# Patient Record
Sex: Female | Born: 1990 | Race: White | Hispanic: Yes | Marital: Single | Smoking: Never smoker
Health system: Southern US, Community
[De-identification: ages and names within clinical notes are randomized; demographics above are authoritative.]

## PROBLEM LIST (undated history)

## (undated) DIAGNOSIS — O139 Gestational [pregnancy-induced] hypertension without significant proteinuria, unspecified trimester: Secondary | ICD-10-CM

## (undated) DIAGNOSIS — Z789 Other specified health status: Secondary | ICD-10-CM

## (undated) HISTORY — DX: Gestational (pregnancy-induced) hypertension without significant proteinuria, unspecified trimester: O13.9

## (undated) HISTORY — PX: BREAST SURGERY: SHX581

## (undated) NOTE — *Deleted (*Deleted)
History     CSN: [id number]  Arrival date and time: 12/01/19 4098   First Provider Initiated Contact with Patient 12/01/19 2111      Chief Complaint  Patient presents with  . Vaginal Bleeding   29 y.o. G1 @16 .1 wks presenting with VB. Bleeding started 5 days ago. She is using multiple panty liners every day. Bleeding is red. Reports passing a large clot into the toilet tonight. Denies abdominal pain or cramping. She was seen 2 days ago in the office and told everything was ok.   OB History    Gravida  1   Para  0   Term  0   Preterm  0   AB  0   Living        SAB  0   TAB  0   Ectopic  0   Multiple      Live Births              Past Medical History:  Diagnosis Date  . Medical history non-contributory     Past Surgical History:  Procedure Laterality Date  . BREAST SURGERY     breast bx left breast    Family History  Problem Relation Age of Onset  . Cancer Mother        pancreatic  . Diabetes Mother   . Diabetes Father   . Diabetes Maternal Grandmother   . Hyperlipidemia Maternal Grandmother   . Diabetes Maternal Grandfather   . Hyperlipidemia Maternal Grandfather   . Hyperlipidemia Paternal Grandmother   . Hyperlipidemia Paternal Grandfather     Social History   Tobacco Use  . Smoking status: Never Smoker  . Smokeless tobacco: Never Used  Vaping Use  . Vaping Use: Never used  Substance Use Topics  . Alcohol use: Never  . Drug use: Never    Allergies: No Known Allergies  Medications Prior to Admission  Medication Sig Dispense Refill Last Dose  . folic acid (FOLVITE) 1 MG tablet Take 1 mg by mouth daily.   12/01/2019 at Unknown time  . Prenatal Vit-Fe Fumarate-FA (PRENATAL MULTIVITAMIN) TABS tablet Take 1 tablet by mouth daily at 12 noon.   12/01/2019 at Unknown time  . Prenatal Vit-Fe Fumarate-FA (PRENATAL MULTIVITAMIN) TABS tablet Take 1 tablet by mouth daily at 12 noon.       Review of Systems  Gastrointestinal: Negative for  abdominal pain.  Genitourinary: Positive for vaginal bleeding.   Physical Exam   Blood pressure 132/73, pulse 89, temperature 99.2 F (37.3 C), temperature source Oral, resp. rate 20, height 5\' 2"  (1.575 m), weight 75.3 kg, last menstrual period 07/27/2019.  Physical Exam Vitals and nursing note reviewed. Exam conducted with a chaperone present.  Constitutional:      General: She is not in acute distress.    Appearance: Normal appearance.  HENT:     Head: Normocephalic and atraumatic.  Cardiovascular:     Rate and Rhythm: Normal rate.  Pulmonary:     Effort: Pulmonary effort is normal. No respiratory distress.  Abdominal:     General: There is no distension.     Palpations: Abdomen is soft.     Tenderness: There is no abdominal tenderness.  Genitourinary:    Comments: External: no lesions or erythema Vagina: rugated, pink, moist, scant pink bloody discharge, cleared with 1 fox swab Cervix closed, thick, soft  Musculoskeletal:        General: Normal range of motion.     Cervical back: Normal  range of motion.  Skin:    General: Skin is warm and dry.  Neurological:     General: No focal deficit present.     Mental Status: She is alert and oriented to person, place, and time.  Psychiatric:        Mood and Affect: Mood normal.        Behavior: Behavior normal.   FHT 168  Results for orders placed or performed during the hospital encounter of 12/01/19 (from the past 24 hour(s))  Urinalysis, Routine w reflex microscopic Urine, Clean Catch     Status: Abnormal   Collection Time: 12/01/19  8:12 PM  Result Value Ref Range   Color, Urine YELLOW YELLOW   APPearance CLEAR CLEAR   Specific Gravity, Urine 1.005 1.005 - 1.030   pH 7.0 5.0 - 8.0   Glucose, UA NEGATIVE NEGATIVE mg/dL   Hgb urine dipstick LARGE (A) NEGATIVE   Bilirubin Urine NEGATIVE NEGATIVE   Ketones, ur NEGATIVE NEGATIVE mg/dL   Protein, ur NEGATIVE NEGATIVE mg/dL   Nitrite NEGATIVE NEGATIVE   Leukocytes,Ua  MODERATE (A) NEGATIVE   RBC / HPF 21-50 0 - 5 RBC/hpf   WBC, UA 11-20 0 - 5 WBC/hpf   Bacteria, UA FEW (A) NONE SEEN   Squamous Epithelial / LPF 0-5 0 - 5   MAU Course  Procedures  MDM Labs and [place] ordered. Transfer of care given to Lorenda Peck, [place]  12/01/2019 9:47 PM  3.8  Assessment and Plan

---

## 2018-08-19 ENCOUNTER — Other Ambulatory Visit: Payer: Self-pay

## 2018-08-20 ENCOUNTER — Ambulatory Visit (INDEPENDENT_AMBULATORY_CARE_PROVIDER_SITE_OTHER): Payer: Managed Care, Other (non HMO) | Admitting: Obstetrics & Gynecology

## 2018-08-20 ENCOUNTER — Encounter: Payer: Self-pay | Admitting: Obstetrics & Gynecology

## 2018-08-20 VITALS — BP 120/78 | Ht 62.0 in | Wt 147.0 lb

## 2018-08-20 DIAGNOSIS — Z01419 Encounter for gynecological examination (general) (routine) without abnormal findings: Secondary | ICD-10-CM | POA: Diagnosis not present

## 2018-08-20 DIAGNOSIS — N970 Female infertility associated with anovulation: Secondary | ICD-10-CM

## 2018-08-20 NOTE — Progress Notes (Signed)
Laura [name] Jan 28, 1990 [id number]   History:    28 y.o.  G0 Married x 5 yrs  RP:  New patient presenting for annual gyn exam   HPI: Attempting conception x 2 yrs.  Menses every 1-2 months, normal flow.  No BTB.  No pelvic pain.  No pain with IC.  Having timed IC.  Breasts normal.  BMI 26.89.  No H/O STI.  Husband had a conception which resulted in TAb 6 yrs ago with previous partner.  Past medical history,surgical history, family history and social history were all reviewed and documented in the EPIC chart.  Gynecologic History Patient's last menstrual period was 08/12/2018. Contraception: none Last Pap: 06/2017. Results were: normal per patient.  1 abnormal pap 4 yrs ago, Colpo normal per patient. Last mammogram: Never Bone Density: Never Colonoscopy: Never  Obstetric History OB History  Gravida Para Term Preterm AB Living  0 0 0 0 0 0  SAB TAB Ectopic Multiple Live Births  0 0 0 0 0     ROS: A ROS was performed and pertinent positives and negatives are included in the history.  GENERAL: No fevers or chills. HEENT: No change in vision, no earache, sore throat or sinus congestion. NECK: No pain or stiffness. CARDIOVASCULAR: No chest pain or pressure. No palpitations. PULMONARY: No shortness of breath, cough or wheeze. GASTROINTESTINAL: No abdominal pain, nausea, vomiting or diarrhea, melena or bright red blood per rectum. GENITOURINARY: No urinary frequency, urgency, hesitancy or dysuria. MUSCULOSKELETAL: No joint or muscle pain, no back pain, no recent trauma. DERMATOLOGIC: No rash, no itching, no lesions. ENDOCRINE: No polyuria, polydipsia, no heat or cold intolerance. No recent change in weight. HEMATOLOGICAL: No anemia or easy bruising or bleeding. NEUROLOGIC: No headache, seizures, numbness, tingling or weakness. PSYCHIATRIC: No depression, no loss of interest in normal activity or change in sleep pattern.     Exam:   BP 120/78    Ht 5\' 2"  (1.575 m)    Wt 147 lb (66.7  kg)    LMP 08/12/2018    BMI 26.89 kg/m   Body mass index is 26.89 kg/m.  General appearance : Well developed well nourished female. No acute distress HEENT: Eyes: no retinal hemorrhage or exudates,  Neck supple, trachea midline, no carotid bruits, no thyroidmegaly Lungs: Clear to auscultation, no rhonchi or wheezes, or rib retractions  Heart: Regular rate and rhythm, no murmurs or gallops Breast:Examined in sitting and supine position were symmetrical in appearance, no palpable masses or tenderness,  no skin retraction, no nipple inversion, no nipple discharge, no skin discoloration, no axillary or supraclavicular lymphadenopathy Abdomen: no palpable masses or tenderness, no rebound or guarding Extremities: no edema or skin discoloration or tenderness  Pelvic: Vulva: Normal             Vagina: No gross lesions or discharge  Cervix: No gross lesions or discharge.  Pap reflex done.  Uterus  AV, normal size, shape and consistency, non-tender and mobile  Adnexa  Without masses or tenderness  Anus: Normal   Assessment/Plan:  28 y.o. female for annual exam   1. Encounter for routine gynecological examination with Papanicolaou smear of cervix Normal gynecologic exam.  Pap reflex done.  Breast exam normal.  Body mass index 26.89.  Continue with healthy nutrition and fitness.  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  Will investigate for PCOS.  Rule out tubal disease with a HSG under Doxy and rule out female factor with a  sperm Analysis.  Will probably start on clomiphene stimulation of ovulation per results. - TSH; Future - Prolactin; Future - Hemoglobin A1C; Future - Progesterone; Future  Other orders - famotidine (PEPCID) 20 MG tablet; Take 20 mg by mouth 2 (two) times daily.  Counseling on above issues and coordination of care more than 50% for 20 minutes.  Genia DelMarie-Lyne Woodrow Drab MD, 11:41 AM 08/20/2018

## 2018-08-23 ENCOUNTER — Encounter: Payer: Self-pay | Admitting: Obstetrics & Gynecology

## 2018-08-23 LAB — PAP IG W/ RFLX HPV ASCU

## 2018-08-23 NOTE — Patient Instructions (Signed)
1. Encounter for routine gynecological examination with Papanicolaou smear of cervix Normal gynecologic exam.  Pap reflex done.  Breast exam normal.  Body mass index 26.89.  Continue with healthy nutrition and fitness.  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  Will investigate for PCOS.  Rule out tubal disease with a HSG under Doxy and rule out female factor with a sperm Analysis.  Will probably start on clomiphene stimulation of ovulation per results. - TSH; Future - Prolactin; Future - Hemoglobin A1C; Future - Progesterone; Future  Other orders - famotidine (PEPCID) 20 MG tablet; Take 20 mg by mouth 2 (two) times daily.  Lamar Benes, fue un placer de encontrarle hoy!  Voy a informarle de sus [employer].

## 2018-08-24 ENCOUNTER — Telehealth: Payer: Self-pay | Admitting: *Deleted

## 2018-08-24 NOTE — Telephone Encounter (Signed)
-----   Message from Princess Bruins, MD sent at 08/20/2018 12:00 PM EDT ----- Schedule HSG under Doxy and Sperm analysis.

## 2018-08-24 NOTE — Telephone Encounter (Signed)
Referral faxed to [facility] they will call to schedule.  

## 2019-01-28 NOTE — L&D Delivery Note (Signed)
Patient was C/C and fetus delivered spontaneously intact.  NSVD  Female infant c/w about 18 weeks, Apgars 0,0, weight P.   The patient had no lacerations.  Placenta has not yet delivered at this time.   Laura [name]

## 2019-02-03 ENCOUNTER — Other Ambulatory Visit: Payer: Self-pay

## 2019-02-07 ENCOUNTER — Encounter: Payer: Self-pay | Admitting: Obstetrics & Gynecology

## 2019-02-07 ENCOUNTER — Ambulatory Visit (INDEPENDENT_AMBULATORY_CARE_PROVIDER_SITE_OTHER): Payer: Managed Care, Other (non HMO) | Admitting: Obstetrics & Gynecology

## 2019-02-07 ENCOUNTER — Other Ambulatory Visit: Payer: Self-pay

## 2019-02-07 VITALS — BP 106/70

## 2019-02-07 DIAGNOSIS — Z3200 Encounter for pregnancy test, result unknown: Secondary | ICD-10-CM | POA: Diagnosis not present

## 2019-02-07 LAB — PREGNANCY, URINE: Preg Test, Ur: NEGATIVE

## 2019-02-07 NOTE — Patient Instructions (Signed)
1. Possible pregnancy, not yet confirmed Attempting conception for a few years.  Documented ovulation in urine, but menstrual periods are every 1 to 2 months.  One home pregnancy test positive, but then negative.  Urine pregnancy test negative here today.  Will confirm with serum qualitative hCG.  If no menstrual period,  will come back to perform a pregnancy test again.  Continue on prenatal vitamins.  Will continue to attempt conception without fertility treatment. - Pregnancy, urine - hCG, serum, qualitative  Other orders - Prenatal Vit-Fe Fumarate-FA (PRENATAL MULTIVITAMIN) TABS tablet; Take 1 tablet by mouth daily at 12 noon.  Tenna Child, fue un placer verle hoy!  Voy a informarle de sus [employer].

## 2019-02-07 NOTE — Progress Notes (Signed)
    Tomorrow Laura [name] 12/19/1990 [id number]        29 y.o.  G0 Married x 5 1/2 yrs.  RP: Urin HPT positive x1  HPI: Attempting conception for a few years.  Menstrual periods every 1 to 2 months.  LMP normal 01/07/2019.  Ovulatory test positive 01/18/19 and sexually active at that time.  Had 1 HPT positive, but repeated it and it was negative.  No pelvic pain, mild premenstrual cramps.  Normal vaginal secretions.  Urine/BMs normal.   OB History  Gravida Para Term Preterm AB Living  0 0 0 0 0 0  SAB TAB Ectopic Multiple Live Births  0 0 0 0 0    Past medical history,surgical history, problem list, medications, allergies, family history and social history were all reviewed and documented in the EPIC chart.   Directed ROS with pertinent positives and negatives documented in the history of present illness/assessment and plan.  Exam:  Vitals:   02/07/19 0935  BP: 106/70   General appearance:  Normal  Abdomen: Normal  Gynecologic exam: Vulva normal. Uterus AV/normal volume, mobile, NT.  No adnexal mass, NT.  UPT Negative   Assessment/Plan:  30 y.o. G0P0000   1. Possible pregnancy, not yet confirmed Attempting conception for a few years.  Documented ovulation in urine, but menstrual periods are every 1 to 2 months.  One home pregnancy test positive, but then negative.  Urine pregnancy test negative here today.  Will confirm with serum qualitative hCG.  If no menstrual period,  will come back to perform a pregnancy test again.  Continue on prenatal vitamins.  Will continue to attempt conception without fertility treatment. - Pregnancy, urine - hCG, serum, qualitative  Other orders - Prenatal Vit-Fe Fumarate-FA (PRENATAL MULTIVITAMIN) TABS tablet; Take 1 tablet by mouth daily at 12 noon.  Counseling on above issues and coordination of care more than 50% for 25 minutes.  [name] MD, 10:06 AM 02/07/2019

## 2019-02-08 LAB — HCG, SERUM, QUALITATIVE: Preg, Serum: NEGATIVE

## 2019-10-26 ENCOUNTER — Encounter (HOSPITAL_COMMUNITY): Payer: Self-pay | Admitting: Obstetrics and Gynecology

## 2019-10-26 ENCOUNTER — Other Ambulatory Visit: Payer: Self-pay

## 2019-10-26 ENCOUNTER — Inpatient Hospital Stay (HOSPITAL_COMMUNITY)
Admission: AD | Admit: 2019-10-26 | Discharge: 2019-10-26 | Disposition: A | Discharge status: Home / Self Care | Payer: Self-pay | Attending: Obstetrics and Gynecology | Admitting: Obstetrics and Gynecology

## 2019-10-26 DIAGNOSIS — Z3A13 13 weeks gestation of pregnancy: Secondary | ICD-10-CM | POA: Insufficient documentation

## 2019-10-26 DIAGNOSIS — R109 Unspecified abdominal pain: Secondary | ICD-10-CM | POA: Insufficient documentation

## 2019-10-26 DIAGNOSIS — O209 Hemorrhage in early pregnancy, unspecified: Secondary | ICD-10-CM | POA: Insufficient documentation

## 2019-10-26 DIAGNOSIS — N939 Abnormal uterine and vaginal bleeding, unspecified: Secondary | ICD-10-CM

## 2019-10-26 DIAGNOSIS — O26891 Other specified pregnancy related conditions, first trimester: Secondary | ICD-10-CM | POA: Insufficient documentation

## 2019-10-26 DIAGNOSIS — R110 Nausea: Secondary | ICD-10-CM | POA: Insufficient documentation

## 2019-10-26 DIAGNOSIS — R102 Pelvic and perineal pain: Secondary | ICD-10-CM | POA: Insufficient documentation

## 2019-10-26 DIAGNOSIS — O4691 Antepartum hemorrhage, unspecified, first trimester: Secondary | ICD-10-CM

## 2019-10-26 LAB — WET PREP, GENITAL
Clue Cells Wet Prep HPF POC: NONE SEEN
Sperm: NONE SEEN
Trich, Wet Prep: NONE SEEN
Yeast Wet Prep HPF POC: NONE SEEN

## 2019-10-26 LAB — URINALYSIS, ROUTINE W REFLEX MICROSCOPIC
Bilirubin Urine: NEGATIVE
Glucose, UA: NEGATIVE mg/dL
Ketones, ur: NEGATIVE mg/dL
Leukocytes,Ua: NEGATIVE
Nitrite: NEGATIVE
Protein, ur: NEGATIVE mg/dL
Specific Gravity, Urine: 1.019 (ref 1.005–1.030)
pH: 6 (ref 5.0–8.0)

## 2019-10-26 LAB — RH IG WORKUP (INCLUDES ABO/RH)
ABO/RH(D): A POS
Gestational Age(Wks): 13

## 2019-10-26 NOTE — Discharge Instructions (Signed)
First Trimester of Pregnancy  The first trimester of pregnancy is from week 1 until the end of week 13 (months 1 through 3). During this time, your baby will begin to develop inside you. At 6-8 weeks, the eyes and face are formed, and the heartbeat can be seen on ultrasound. At the end of 12 weeks, all the baby's organs are formed. Prenatal care is all the medical care you receive before the birth of your baby. Make sure you get good prenatal care and follow all of your doctor's instructions. Follow these instructions at home: Medicines  Take over-the-counter and prescription medicines only as told by your doctor. Some medicines are safe and some medicines are not safe during pregnancy.  Take a prenatal vitamin that contains at least 600 micrograms (mcg) of folic acid.  If you have trouble pooping (constipation), take medicine that will make your stool soft (stool softener) if your doctor approves. Eating and drinking   Eat regular, healthy meals.  Your doctor will tell you the amount of weight gain that is right for you.  Avoid raw meat and uncooked cheese.  If you feel sick to your stomach (nauseous) or throw up (vomit): ? Eat 4 or 5 small meals a day instead of 3 large meals. ? Try eating a few soda crackers. ? Drink liquids between meals instead of during meals.  To prevent constipation: ? Eat foods that are high in fiber, like fresh fruits and vegetables, whole grains, and beans. ? Drink enough fluids to keep your pee (urine) clear or pale yellow. Activity  Exercise only as told by your doctor. Stop exercising if you have cramps or pain in your lower belly (abdomen) or low back.  Do not exercise if it is too hot, too humid, or if you are in a place of great height (high altitude).  Try to avoid standing for long periods of time. Move your legs often if you must stand in one place for a long time.  Avoid heavy lifting.  Wear low-heeled shoes. Sit and stand up  straight.  You can have sex unless your doctor tells you not to. Relieving pain and discomfort  Wear a good support bra if your breasts are sore.  Take warm water baths (sitz baths) to soothe pain or discomfort caused by hemorrhoids. Use hemorrhoid cream if your doctor says it is okay.  Rest with your legs raised if you have leg cramps or low back pain.  If you have puffy, bulging veins (varicose veins) in your legs: ? Wear support hose or compression stockings as told by your doctor. ? Raise (elevate) your feet for 15 minutes, 3-4 times a day. ? Limit salt in your food. Prenatal care  Schedule your prenatal visits by the twelfth week of pregnancy.  Write down your questions. Take them to your prenatal visits.  Keep all your prenatal visits as told by your doctor. This is important. Safety  Wear your seat belt at all times when driving.  Make a list of emergency phone numbers. The list should include numbers for family, friends, the hospital, and police and fire departments. General instructions  Ask your doctor for a referral to a local prenatal class. Begin classes no later than at the start of month 6 of your pregnancy.  Ask for help if you need counseling or if you need help with nutrition. Your doctor can give you advice or tell you where to go for help.  Do not use hot tubs, steam   rooms, or saunas.  Do not douche or use tampons or scented sanitary pads.  Do not cross your legs for long periods of time.  Avoid all herbs and alcohol. Avoid drugs that are not approved by your doctor.  Do not use any tobacco products, including cigarettes, chewing tobacco, and electronic cigarettes. If you need help quitting, ask your doctor. You may get counseling or other support to help you quit.  Avoid cat litter boxes and soil used by cats. These carry germs that can cause birth defects in the baby and can cause a loss of your baby (miscarriage) or stillbirth.  Visit your dentist.  At home, brush your teeth with a soft toothbrush. Be gentle when you floss. Contact a doctor if:  You are dizzy.  You have mild cramps or pressure in your lower belly.  You have a nagging pain in your belly area.  You continue to feel sick to your stomach, you throw up, or you have watery poop (diarrhea).  You have a bad smelling fluid coming from your vagina.  You have pain when you pee (urinate).  You have increased puffiness (swelling) in your face, hands, legs, or ankles. Get help right away if:  You have a fever.  You are leaking fluid from your vagina.  You have spotting or bleeding from your vagina.  You have very bad belly cramping or pain.  You gain or lose weight rapidly.  You throw up blood. It may look like coffee grounds.  You are around people who have German measles, fifth disease, or chickenpox.  You have a very bad headache.  You have shortness of breath.  You have any kind of trauma, such as from a fall or a car accident. Summary  The first trimester of pregnancy is from week 1 until the end of week 13 (months 1 through 3).  To take care of yourself and your unborn baby, you will need to eat healthy meals, take medicines only if your doctor tells you to do so, and do activities that are safe for you and your baby.  Keep all follow-up visits as told by your doctor. This is important as your doctor will have to ensure that your baby is healthy and growing well. This information is not intended to replace advice given to you by your health care provider. Make sure you discuss any questions you have with your health care provider. Document Revised: 05/06/2018 Document Reviewed: 01/22/2016 Elsevier Patient Education  2020 [employer]  

## 2019-10-26 NOTE — MAU Provider Note (Addendum)
History     CSN: [id number]  Arrival date and time: 10/26/19 1516   First Provider Initiated Contact with Patient 10/26/19 1625      Chief Complaint  Patient presents with  . Vaginal Bleeding   HPI: Laura [name] is a 29 y.o. G1P0000 female here today at [redacted]w[redacted]d with complaints of vaginal bleeding and suprapubic pain. Her pain began yesterday and is intermittent with a peak intensity of 6/10. The bleeding began this morning and she described it as dark brown and a small amount when she wiped/in her underwear. The bleeding is not enough to wear a tampon or pad. She states she has some nausea, but that has been present this pregnancy. Denies vomiting, constipation, diarrhea, last BM at 1400 today. Last intercourse was 2 weeks ago.   OB History    Gravida  1   Para  0   Term  0   Preterm  0   AB  0   Living  0     SAB  0   TAB  0   Ectopic  0   Multiple  0   Live Births  0           History reviewed. No pertinent past medical history.  Past Surgical History:  Procedure Laterality Date  . BREAST SURGERY     breast bx left breast    Family History  Problem Relation Age of Onset  . Cancer Mother        pancreatic  . Diabetes Mother   . Diabetes Father   . Diabetes Maternal Grandmother   . Hyperlipidemia Maternal Grandmother   . Diabetes Maternal Grandfather   . Hyperlipidemia Maternal Grandfather   . Hyperlipidemia Paternal Grandmother   . Hyperlipidemia Paternal Grandfather     Social History   Tobacco Use  . Smoking status: Never Smoker  . Smokeless tobacco: Never Used  Vaping Use  . Vaping Use: Never used  Substance Use Topics  . Alcohol use: Not Currently  . Drug use: Never    Allergies: No Known Allergies  Medications Prior to Admission  Medication Sig Dispense Refill Last Dose  . Prenatal Vit-Fe Fumarate-FA (PRENATAL MULTIVITAMIN) TABS tablet Take 1 tablet by mouth daily at 12 noon.   10/25/2019 at Unknown time    Review of Systems   Constitutional: Negative for activity change, appetite change, fatigue and fever.  Gastrointestinal: Positive for abdominal pain and nausea. Negative for constipation, diarrhea and vomiting.  Genitourinary: Positive for pelvic pain and vaginal bleeding. Negative for difficulty urinating, dysuria and urgency.  All other systems reviewed and are negative.  Physical Exam   Blood pressure 127/78, pulse 87, temperature 98.5 F (36.9 C), resp. rate 18, height 5\' 2"  (1.575 m), weight 73.4 kg, last menstrual period 07/27/2019, SpO2 100 %.  Physical Exam Constitutional:      General: She is not in acute distress.    Appearance: Normal appearance.  Cardiovascular:     Rate and Rhythm: Normal rate and regular rhythm.     Pulses: Normal pulses.     Heart sounds: Normal heart sounds. No murmur heard.   Pulmonary:     Effort: Pulmonary effort is normal.     Breath sounds: Normal breath sounds. No wheezing.  Abdominal:     General: Abdomen is flat. Bowel sounds are normal.     Palpations: Abdomen is soft.     Tenderness: There is abdominal tenderness in the suprapubic area. There is no guarding or rebound.  Genitourinary:    Comments: NEFG; no blood in the vagina, mucousy-white discharge in the vagina, no lesions on vaginal walls, cervix was closed and non tender on exam, adnexa non-tender. Skin:    General: Skin is warm and dry.     Capillary Refill: Capillary refill takes less than 2 seconds.  Neurological:     General: No focal deficit present.     Mental Status: She is alert and oriented to person, place, and time.  Psychiatric:        Mood and Affect: Mood normal.        Behavior: Behavior normal.    Results for orders placed or performed during the hospital encounter of 10/26/19 (from the past 24 hour(s))  Urinalysis, Routine w reflex microscopic Urine, Clean Catch     Status: Abnormal   Collection Time: 10/26/19  4:26 PM  Result Value Ref Range   Color, Urine YELLOW YELLOW    APPearance HAZY (A) CLEAR   Specific Gravity, Urine 1.019 1.005 - 1.030   pH 6.0 5.0 - 8.0   Glucose, UA NEGATIVE NEGATIVE mg/dL   Hgb urine dipstick SMALL (A) NEGATIVE   Bilirubin Urine NEGATIVE NEGATIVE   Ketones, ur NEGATIVE NEGATIVE mg/dL   Protein, ur NEGATIVE NEGATIVE mg/dL   Nitrite NEGATIVE NEGATIVE   Leukocytes,Ua NEGATIVE NEGATIVE   RBC / HPF 6-10 0 - 5 RBC/hpf   WBC, UA 0-5 0 - 5 WBC/hpf   Bacteria, UA RARE (A) NONE SEEN   Squamous Epithelial / LPF 0-5 0 - 5   Mucus PRESENT   Rh IG workup (includes ABO/Rh)     Status: None   Collection Time: 10/26/19  4:51 PM  Result Value Ref Range   Gestational Age(Wks) 13    ABO/RH(D) A POS    No rh immune globuloin      NOT A RH IMMUNE GLOBULIN CANDIDATE, PT RH POSITIVE Performed at [facility], [address], [place], [place] [address]   Wet prep, genital     Status: Abnormal   Collection Time: 10/26/19  4:53 PM  Result Value Ref Range   Yeast Wet Prep HPF POC NONE SEEN NONE SEEN   Trich, Wet Prep NONE SEEN NONE SEEN   Clue Cells Wet Prep HPF POC NONE SEEN NONE SEEN   WBC, Wet Prep HPF POC MANY (A) NONE SEEN   Sperm NONE SEEN     MAU Course   MDM -Wet prep, GC/Chlamydia collected for cramping suprapubic pain  -ABO-Rh due to patient bleeding  -Confirmed FHT  Assessment and Plan  29 y.o. G1P0000 female at [redacted]w[redacted]d here for complaints of vaginal bleeding and lower abdominal pain.  #Bleeding in first trimester of pregnancy -Keep appointment on Monday at [facility] -strict return precautions discussed with patient  -GC/Chlamydia results pending   [name] [facility] 10/26/2019, 6:04 PM   CNM attestation:  I have seen and examined this patient and agree with above documentation in the PA's note.   Laura [name] is a 29 y.o. G1P0000 at [redacted]w[redacted]d reporting vaginal bleeding earlier today and suprapubic pain that is off and on that is coming and going. Denies LOF,  contractions, vaginal discharge. She denies  dsyuria,   PE: Patient Vitals for the past 24 hrs:  BP Temp Pulse Resp SpO2 Height Weight  10/26/19 1749 127/78 -- 87 -- -- -- --  10/26/19 1543 129/82 98.5 F (36.9 C) 89 18 100 % 5\' 2"  (1.575 m) 73.4 kg   Gen: calm  comfortable, NAD Resp: normal effort, no distress Heart: Regular rate Abd: Soft, NT, gravid, S=D  FHR:175  ROS, labs, PMH reviewed  Orders Placed This Encounter  Procedures  . Wet prep, genital  . Urinalysis, Routine w reflex microscopic Urine, Clean Catch  . Rh IG workup (includes ABO/Rh)  . Discharge patient Discharge disposition: 01-Home or Self Care; Discharge patient date: 10/26/2019  . Discharge patient Discharge disposition: 01-Home or Self Care; Discharge patient date: 10/26/2019   No orders of the defined types were placed in this encounter.   MDM -UA negative for signs of infection -Blood type is A pos -Exam is reassuring, [place] not done because patient had no active bleeding while in MAU and had reassuring FHR.   Assessment: 1. Vaginal bleeding     Plan: -GC pending  - Discharge home in stable condition. - Follow-up as scheduled at your doctor's office for next prenatal visit or sooner as needed if symptoms worsen. Patient has NOB appt on Monday at [facility].  - Return to maternity admissions symptoms worsen  Marylene Land, [facility] 10/26/2019 6:04 PM

## 2019-10-26 NOTE — MAU Note (Signed)
Pt reports she went to the BR today and saw some brown bleeding. C/O some cramping as well. Had positive HPT and [place] blood [place]. Unsure when LMP end of June or beg July

## 2019-10-27 LAB — GC/CHLAMYDIA PROBE AMP (~~LOC~~) NOT AT ARMC
Chlamydia: NEGATIVE
Comment: NEGATIVE
Comment: NORMAL
Neisseria Gonorrhea: NEGATIVE

## 2019-10-28 ENCOUNTER — Encounter: Payer: Managed Care, Other (non HMO) | Admitting: Obstetrics & Gynecology

## 2019-11-12 ENCOUNTER — Encounter (HOSPITAL_COMMUNITY): Payer: Self-pay | Admitting: Obstetrics & Gynecology

## 2019-11-12 ENCOUNTER — Other Ambulatory Visit: Payer: Self-pay

## 2019-11-12 ENCOUNTER — Inpatient Hospital Stay (HOSPITAL_COMMUNITY)
Admission: AD | Admit: 2019-11-12 | Payer: BC Managed Care – PPO | Source: Home / Self Care | Admitting: Obstetrics & Gynecology

## 2019-11-12 ENCOUNTER — Inpatient Hospital Stay (HOSPITAL_COMMUNITY)
Admission: AD | Admit: 2019-11-12 | Discharge: 2019-11-12 | Disposition: A | Discharge status: Home / Self Care | Payer: BC Managed Care – PPO | Attending: Obstetrics & Gynecology | Admitting: Obstetrics & Gynecology

## 2019-11-12 ENCOUNTER — Inpatient Hospital Stay (HOSPITAL_BASED_OUTPATIENT_CLINIC_OR_DEPARTMENT_OTHER): Payer: BC Managed Care – PPO

## 2019-11-12 DIAGNOSIS — O4692 Antepartum hemorrhage, unspecified, second trimester: Secondary | ICD-10-CM

## 2019-11-12 DIAGNOSIS — O209 Hemorrhage in early pregnancy, unspecified: Secondary | ICD-10-CM

## 2019-11-12 DIAGNOSIS — Z3A13 13 weeks gestation of pregnancy: Secondary | ICD-10-CM

## 2019-11-12 DIAGNOSIS — O26891 Other specified pregnancy related conditions, first trimester: Secondary | ICD-10-CM | POA: Insufficient documentation

## 2019-11-12 DIAGNOSIS — R109 Unspecified abdominal pain: Secondary | ICD-10-CM | POA: Insufficient documentation

## 2019-11-12 DIAGNOSIS — M549 Dorsalgia, unspecified: Secondary | ICD-10-CM | POA: Diagnosis not present

## 2019-11-12 DIAGNOSIS — O4691 Antepartum hemorrhage, unspecified, first trimester: Secondary | ICD-10-CM | POA: Diagnosis not present

## 2019-11-12 DIAGNOSIS — Z79899 Other long term (current) drug therapy: Secondary | ICD-10-CM | POA: Insufficient documentation

## 2019-11-12 MED ORDER — ACETAMINOPHEN 500 MG PO TABS
1000.0000 mg | ORAL_TABLET | Freq: Once | ORAL | Status: AC
  Administered 2019-11-12: 1000 mg via ORAL
  Filled 2019-11-12: qty 2

## 2019-11-12 NOTE — Discharge Instructions (Signed)
Hemorragia vaginal durante el segundo trimestre de embarazo  Vaginal Bleeding During Pregnancy, Second Trimester  Durante el embarazo es relativamente comn que se presente un pequeo sangrado (prdidas) proveniente de la vagina. Normalmente esto se detiene por s solo. Hay diversos factores que pueden causar prdidas en Firefighter. A veces, la hemorragia es normal y no es signo de un problema en Firefighter. Sin embargo, la hemorragia tambin puede ser un signo de algo grave. Debe informar a su mdico de inmediato si tiene alguna hemorragia vaginal. Algunas causas posibles de hemorragia vaginal durante el segundo trimestre incluyen lo siguiente:  Infeccin, inflamacin o crecimientos anmalos (plipos) en el cuello uterino.  Una afeccin por la cual la placenta cubre parcial o completamente la abertura del cuello uterino (placenta previa).  La placenta se separa del tero (desprendimiento de placenta).  Trabajo de parto temprano (prematuro).  Se abre y se afina el cuello uterino antes que el [employer] llegue a trmino y antes de Games developer el trabajo de parto ( cuello uterino incompetente).  Una masa de tejido que crece en el tero debido a una mala fertilizacin del vulo (embarazo molar). Siga estas indicaciones en su casa: Actividad  Siga las indicaciones del mdico respecto de las restricciones en las actividades. Pregunte qu actividades son seguras para usted.  Si es necesario, organcese para que alguien la ayude con las actividades cotidianas.  No haga ejercicios ni actividades que requieran mucho esfuerzo hasta que su mdico lo autorice.  No levante ningn objeto que pese ms de 10libras (4,5kg) o el lmite de peso que le indique su mdico hasta que l le diga que puede [place].  No tenga relaciones sexuales ni orgasmos hasta que su mdico le diga que es seguro. Medicamentos  [employer] de venta libre y los recetados solamente como se lo haya indicado el  mdico.  No tome aspirina, ya que puede causar hemorragias. Instrucciones generales  Est atenta a cualquier cambio en los sntomas.  Lleve un registro de la cantidad de apsitos higinicos que [place] por da, la frecuencia con la que los cambia y cun empapados (saturados) estn.  No use tampones ni se haga duchas vaginales.  Si elimina tejido por la vagina, gurdelo para mostrrselo al [employer].  Concurra a todas las visitas de control como se lo haya indicado el mdico. Esto es importante. Comunquese con un mdico si:  Tiene un sangrado vaginal en cualquier momento del embarazo.  Tiene calambres o dolores de [place].  Tiene fiebre que no baja cuando toma medicamentos. Solicite ayuda de inmediato si:  Tiene clicos intensos en la espalda o el abdomen.  Siente contracciones.  Tiene escalofros.  Elimina cogulos grandes o gran cantidad de tejido por la vagina.  La hemorragia aumenta.  Se siente mareada, dbil, o se desmaya.  Tiene una prdida importante o sale lquido a borbotones por la vagina. Resumen  Hay diversos factores que pueden causar hemorragia o prdidas en el embarazo.  Debe informar a su mdico de inmediato si tiene alguna hemorragia vaginal.  Siga las indicaciones del mdico respecto de las restricciones en las [place]. Pregunte qu actividades son seguras para usted. Esta informacin no tiene Theme park manager el consejo del mdico. Asegrese de hacerle al mdico cualquier pregunta que tenga. Document Revised: 10/09/2016 Document Reviewed: 10/09/2016 Elsevier Patient Education  2020 [employer].

## 2019-11-12 NOTE — MAU Provider Note (Signed)
History     CSN: [id number]  Arrival date and time: 11/12/19 0147   First Provider Initiated Contact with Patient 11/12/19 0325      Chief Complaint  Patient presents with  . Vaginal Bleeding   Laura [name] is a 29 y.o. G1P0 at [redacted]w[redacted]d who receives care at [facility].  She presents today for Vaginal Bleeding.  Patient reports she started having heavy bleeding around 2130-2200.  She states she also started experiencing cramping at that time that she reports is intermittent.  Patient rates the pain a 6/10 and does not identify any worsening or alleviating factors.  Patient denies issues or pain with urination, constipation, diarrhea, or vomiting.  She endorses some nausea and sexual activity on Wednesday, but no pain or discomfort.     OB History    Gravida  1   Para      Term      Preterm      AB      Living        SAB      TAB      Ectopic      Multiple      Live Births              No past medical history on file.  History reviewed. No pertinent surgical history.  History reviewed. No pertinent family history.  Social History   Tobacco Use  . Smoking status: Never Smoker  Vaping Use  . Vaping Use: Never used  Substance Use Topics  . Alcohol use: Never  . Drug use: Never    Allergies: No Known Allergies  Medications Prior to Admission  Medication Sig Dispense Refill Last Dose  . folic acid (FOLVITE) 1 MG tablet Take 1 mg by mouth daily.   11/11/2019 at Unknown time  . Prenatal Vit-Fe Fumarate-FA (PRENATAL MULTIVITAMIN) TABS tablet Take 1 tablet by mouth daily at 12 noon.   11/11/2019 at Unknown time    Review of Systems  Constitutional: Negative for chills and fever.  Respiratory: Negative for cough and shortness of breath.   Gastrointestinal: Positive for abdominal pain (Cramping) and nausea. Negative for vomiting.  Genitourinary: Positive for vaginal bleeding. Negative for difficulty urinating, dysuria and vaginal discharge.   Musculoskeletal: Positive for back pain.   Physical Exam   Blood pressure 125/75, pulse 95, temperature 98.9 F (37.2 C), temperature source Oral, resp. rate 20, height 5\' 2"  (1.575 m), weight 74.7 kg.  Physical Exam Vitals and nursing note reviewed. Exam conducted with a chaperone present.  Constitutional:      General: She is not in acute distress.    Appearance: Normal appearance.  HENT:     Head: Normocephalic and atraumatic.  Eyes:     Conjunctiva/sclera: Conjunctivae normal.  Cardiovascular:     Rate and Rhythm: Normal rate.  Pulmonary:     Effort: Pulmonary effort is normal. No respiratory distress.  Abdominal:     General: Abdomen is flat.     Palpations: Abdomen is soft.     Tenderness: There is no abdominal tenderness.  Genitourinary:    Comments: Speculum Exam: -Normal External Genitalia: Non tender, Blood noted at introitus.  -Vaginal Vault: Pink mucosa with good rugae. Moderate amt dark red blood noted and removed with faux swab x 3 - -Cervix:Pink, no lesions, cysts, or polyps.  Appears slightly open with questionable polyp within os. Tender when touched with faux swab. No active bleeding from os- -Bimanual Exam:  Deferred  Musculoskeletal:  General: Normal range of motion.     Cervical back: Normal range of motion.  Skin:    General: Skin is warm and dry.  Neurological:     Mental Status: She is alert and oriented to person, place, and time.  Psychiatric:        Mood and Affect: Mood normal.        Behavior: Behavior normal.        Thought Content: Thought content normal.     MAU Course  Procedures No results found for this or any previous visit (from the past 24 hour(s)). US OB Comp Less 14 Wks  Result Date: 11/12/2019 CLINICAL DATA:  Pregnant, spotting EXAM: OBSTETRIC <14 WK ULTRASOUND TECHNIQUE: Transabdominal ultrasound was performed for evaluation of the gestation as well as the maternal uterus and adnexal regions. COMPARISON:  None.  FINDINGS: Intrauterine gestational sac: Single Yolk sac:  Not Visualized. Embryo:  Visualized. Cardiac Activity: Visualized. Heart Rate: 169 bpm CRL:   73.0 mm   13 w 3 d                  [place] EDC: 05/16/2020 Subchorionic hemorrhage:  None visualized. Maternal uterus/adnexae: Left ovary is within normal limits. Right ovary is not discretely visualized. No free fluid. IMPRESSION: Single live intrauterine gestation, with estimated gestational age [redacted] weeks 3 days by crown-rump length, as above. Electronically Signed   By: Charline Bills M.D.   On: 11/12/2019 04:15    MDM Pelvic Exam Pain medication Ultrasound  Assessment and Plan  29 year old, G1P0  SIUP at 13.3weeks Vaginal Bleeding A Positive   -Reviewed POC with patient. -Exam performed and findings discussed.  -Informed that cultures would be deferred as just collected two weeks ago. -Blood type A positive: Rhogam deferred. -Patient offered and accepts pain medication.  Will give tylenol. -Will send for [place] and await results.  Cherre Robins 11/12/2019, 3:25 AM   Reassessment (4:19 AM)  -[place] Results as above. -Informed that no SCH noted. -Reassured that vaginal bleeding during early pregnancy is common and cause is frequently unknown. -Discussed pelvic rest for at least 72 hours after cessation of bleeding incidents. -Discussed follow up with primary ob. -Patient without questions or concerns. -Encouraged to call primary ob or return to MAU if symptoms worsen or with the onset of new symptoms. -Discharged to home in stable condition.  Cherre Robins MSN, CNM Advanced Practice Provider, [facility] [employer]

## 2019-11-12 NOTE — MAU Note (Signed)
PT SAYS SHE WAS BLEEDING BROWN 2-3 WEEKS AGO.  RED BLEEDING AT 9PM. . IN BLACK  UNDERWEAR IN TRIAGE - A SMALL AMT OF WETNESS.  SEES BLOOD WHEN SHE WIPES.  FEELS CRAMPING -  STARTED AT 10PM.

## 2019-11-14 ENCOUNTER — Encounter (HOSPITAL_COMMUNITY): Payer: Self-pay | Admitting: Obstetrics and Gynecology

## 2019-12-01 ENCOUNTER — Inpatient Hospital Stay (HOSPITAL_COMMUNITY)
Admission: AD | Admit: 2019-12-01 | Discharge: 2019-12-01 | Disposition: A | Discharge status: Home / Self Care | Payer: BC Managed Care – PPO | Attending: Obstetrics and Gynecology | Admitting: Obstetrics and Gynecology

## 2019-12-01 ENCOUNTER — Inpatient Hospital Stay (HOSPITAL_BASED_OUTPATIENT_CLINIC_OR_DEPARTMENT_OTHER): Payer: BC Managed Care – PPO

## 2019-12-01 ENCOUNTER — Encounter (HOSPITAL_COMMUNITY): Payer: Self-pay | Admitting: Obstetrics & Gynecology

## 2019-12-01 DIAGNOSIS — O4690 Antepartum hemorrhage, unspecified, unspecified trimester: Secondary | ICD-10-CM

## 2019-12-01 DIAGNOSIS — Z3A16 16 weeks gestation of pregnancy: Secondary | ICD-10-CM | POA: Diagnosis not present

## 2019-12-01 DIAGNOSIS — Z79899 Other long term (current) drug therapy: Secondary | ICD-10-CM | POA: Diagnosis not present

## 2019-12-01 DIAGNOSIS — O208 Other hemorrhage in early pregnancy: Secondary | ICD-10-CM | POA: Insufficient documentation

## 2019-12-01 DIAGNOSIS — O4692 Antepartum hemorrhage, unspecified, second trimester: Secondary | ICD-10-CM

## 2019-12-01 HISTORY — DX: Other specified health status: Z78.9

## 2019-12-01 LAB — URINALYSIS, ROUTINE W REFLEX MICROSCOPIC
Bilirubin Urine: NEGATIVE
Glucose, UA: NEGATIVE mg/dL
Ketones, ur: NEGATIVE mg/dL
Nitrite: NEGATIVE
Protein, ur: NEGATIVE mg/dL
Specific Gravity, Urine: 1.005 (ref 1.005–1.030)
pH: 7 (ref 5.0–8.0)

## 2019-12-01 LAB — CBC
HCT: 35.2 % — ABNORMAL LOW (ref 36.0–46.0)
Hemoglobin: 11.6 g/dL — ABNORMAL LOW (ref 12.0–15.0)
MCH: 26.7 pg (ref 26.0–34.0)
MCHC: 33 g/dL (ref 30.0–36.0)
MCV: 81.1 fL (ref 80.0–100.0)
Platelets: 197 10*3/uL (ref 150–400)
RBC: 4.34 MIL/uL (ref 3.87–5.11)
RDW: 12.1 % (ref 11.5–15.5)
WBC: 13 10*3/uL — ABNORMAL HIGH (ref 4.0–10.5)
nRBC: 0 % (ref 0.0–0.2)

## 2019-12-01 NOTE — MAU Provider Note (Addendum)
History     CSN: [id number]  Arrival date and time: 12/01/19 3267   First Provider Initiated Contact with Patient 12/01/19 2111      Chief Complaint  Patient presents with  . Vaginal Bleeding   29 y.o. G1 @16 .1 wks presenting with VB. Bleeding started 5 days ago. She is using multiple panty liners every day. Bleeding is red. Reports passing a large clot into the toilet tonight. Denies abdominal pain or cramping. She was seen 2 days ago in the office and told everything was ok.   RN Note: PT SAYS STARTED BLEEDING ON SAT- CONTINUED.  THEN TONIGHT WENT TO B-ROOM AT 540PM- SAW BLOOD CLO T SIZE OF AN APPLE . NO CRAMPS . NOW IN TRIAGE - SMALL SPOT OF RED BLOOD. PNC WITH [facility]-WAS THERE ON Tuesday- DR HORVATH-  LOOKED AT BLEEDING - GOT FHR. . IF WORSE - COME IN .  BLEEDING NOW IS SAME AS SAT - EXCEPT CLOT.   OB History    Gravida  1   Para  0   Term  0   Preterm  0   AB  0   Living        SAB  0   TAB  0   Ectopic  0   Multiple      Live Births              Past Medical History:  Diagnosis Date  . Medical history non-contributory     Past Surgical History:  Procedure Laterality Date  . BREAST SURGERY     breast bx left breast    Family History  Problem Relation Age of Onset  . Cancer Mother        pancreatic  . Diabetes Mother   . Diabetes Father   . Diabetes Maternal Grandmother   . Hyperlipidemia Maternal Grandmother   . Diabetes Maternal Grandfather   . Hyperlipidemia Maternal Grandfather   . Hyperlipidemia Paternal Grandmother   . Hyperlipidemia Paternal Grandfather     Social History   Tobacco Use  . Smoking status: Never Smoker  . Smokeless tobacco: Never Used  Vaping Use  . Vaping Use: Never used  Substance Use Topics  . Alcohol use: Never  . Drug use: Never    Allergies: No Known Allergies  Medications Prior to Admission  Medication Sig Dispense Refill Last Dose  . folic acid (FOLVITE) 1 MG tablet Take 1 mg by mouth  daily.   12/01/2019 at Unknown time  . Prenatal Vit-Fe Fumarate-FA (PRENATAL MULTIVITAMIN) TABS tablet Take 1 tablet by mouth daily at 12 noon.   12/01/2019 at Unknown time  . Prenatal Vit-Fe Fumarate-FA (PRENATAL MULTIVITAMIN) TABS tablet Take 1 tablet by mouth daily at 12 noon.       Review of Systems  Gastrointestinal: Negative for abdominal pain.  Genitourinary: Positive for vaginal bleeding.   Physical Exam   Blood pressure 132/73, pulse 89, temperature 99.2 F (37.3 C), temperature source Oral, resp. rate 20, height 5\' 2"  (1.575 m), weight 75.3 kg, last menstrual period 07/27/2019.  Physical Exam Vitals and nursing note reviewed. Exam conducted with a chaperone present.  Constitutional:      General: She is not in acute distress.    Appearance: Normal appearance.  HENT:     Head: Normocephalic and atraumatic.  Cardiovascular:     Rate and Rhythm: Normal rate.  Pulmonary:     Effort: Pulmonary effort is normal. No respiratory distress.  Abdominal:  General: There is no distension.     Palpations: Abdomen is soft.     Tenderness: There is no abdominal tenderness.  Genitourinary:    Comments: External: no lesions or erythema Vagina: rugated, pink, moist, scant pink bloody discharge, cleared with 1 fox swab Cervix closed, thick, soft  Musculoskeletal:        General: Normal range of motion.     Cervical back: Normal range of motion.  Skin:    General: Skin is warm and dry.  Neurological:     General: No focal deficit present.     Mental Status: She is alert and oriented to person, place, and time.  Psychiatric:        Mood and Affect: Mood normal.        Behavior: Behavior normal.   FHT 168  Results for orders placed or performed during the hospital encounter of 12/01/19 (from the past 24 hour(s))  Urinalysis, Routine w reflex microscopic Urine, Clean Catch     Status: Abnormal   Collection Time: 12/01/19  8:12 PM  Result Value Ref Range   Color, Urine YELLOW  YELLOW   APPearance CLEAR CLEAR   Specific Gravity, Urine 1.005 1.005 - 1.030   pH 7.0 5.0 - 8.0   Glucose, UA NEGATIVE NEGATIVE mg/dL   Hgb urine dipstick LARGE (A) NEGATIVE   Bilirubin Urine NEGATIVE NEGATIVE   Ketones, ur NEGATIVE NEGATIVE mg/dL   Protein, ur NEGATIVE NEGATIVE mg/dL   Nitrite NEGATIVE NEGATIVE   Leukocytes,Ua MODERATE (A) NEGATIVE   RBC / HPF 21-50 0 - 5 RBC/hpf   WBC, UA 11-20 0 - 5 WBC/hpf   Bacteria, UA FEW (A) NONE SEEN   Squamous Epithelial / LPF 0-5 0 - 5  CBC     Status: Abnormal   Collection Time: 12/01/19  9:46 PM  Result Value Ref Range   WBC 13.0 (H) 4.0 - 10.5 K/uL   RBC 4.34 3.87 - 5.11 MIL/uL   Hemoglobin 11.6 (L) 12.0 - 15.0 g/dL   HCT 04.5 (L) 36 - 46 %   MCV 81.1 80.0 - 100.0 fL   MCH 26.7 26.0 - 34.0 pg   MCHC 33.0 30.0 - 36.0 g/dL   RDW 40.9 81.1 - 91.4 %   Platelets 197 150 - 400 K/uL   nRBC 0.0 0.0 - 0.2 %    MAU Course  Procedures  MDM Labs and [place] ordered. Transfer of care given to Lorenda Peck, CNM  12/01/2019 9:47 PM  3.8  US done.  No abruption noted.  Anterior placenta, marginal cord insertion                  Noted echogenic bowel, ? Significance at this age                  Cervix length is 3.8cm  Assessment and Plan  A:  Single IUP at [redacted]w[redacted]d       Vaginal bleeding in second trimester, unclear source       Normal cervix length  P:  Discharge home       Reviewed US findings but did not get into cord insertion or echogenic bowel since she has an anatomy US coming up,, and findings may change       Pelvic rest       Follow up in office       Encouraged to return here or to other Urgent Care/ED if she develops worsening of symptoms, increase in  pain, fever, or other concerning symptoms.   Aviva Signs, CNM

## 2019-12-01 NOTE — Discharge Instructions (Signed)
Activity Restriction During Pregnancy Your health care provider may recommend specific activity restrictions during pregnancy for a variety of reasons. Activity restriction may require that you limit activities that require great effort, such as exercise, lifting, or sex. The type of activity restriction will vary for each person, depending on your risk or the problems you are having. Activity restriction may be recommended for a period of time until your baby is delivered. Why are activity restrictions recommended? Activity restriction may be recommended if:  Your placenta is partially or completely covering the opening of your cervix (placenta previa).  There is bleeding between the wall of the uterus and the amniotic sac in the first trimester of pregnancy (subchorionic hemorrhage).  You went into labor too early (preterm labor).  You have a history of miscarriage.  You have a condition that causes high blood pressure during pregnancy (preeclampsia or eclampsia).  You are pregnant with more than one baby.  Your baby is not growing well. What are the risks? The risks depend on your specific restriction. Strict bed rest has the most physical and emotional risks and is no longer routinely recommended. Risks of strict bed rest include:  Loss of muscle conditioning from not moving.  Blood clots.  Social isolation.  Depression.  Loss of income. Talk with your health care team about activity restriction to decide if it is best for you and your baby. Even if you are having problems during your pregnancy, you may be able to continue with normal levels of activity with careful monitoring by your health care team. Follow these instructions at home: If needed, based on your overall health and the health of your baby, your health care provider will decide which type of activity restriction is right for you. Activity restrictions may include:  Not lifting anything heavier than 10 pounds (4.5  kg).  Avoiding activities that take a lot of physical effort.  No lifting or straining.  Resting in a sitting position or lying down for periods of time during the day. Pelvic rest may be recommended along with activity restrictions. If pelvic rest is recommended, then:  Do not have sex, an orgasm, or use sexual stimulation.  Do not use tampons. Do not douche. Do not put anything into your vagina.  Do not lift anything that is heavier than 10 lb (4.5 kg).  Avoid activities that require a lot of effort.  Avoid any activity in which your pelvic muscles could become strained, such as squatting. Questions to ask your health care provider  Why is my activity being limited?  How will activity restrictions affect my body?  Why is rest helpful for me and my baby?  What activities can I do?  When can I return to normal activities? When should I seek immediate medical care? Seek immediate medical care if you have:  Vaginal bleeding.  Vaginal discharge.  Cramping pain in your lower abdomen.  Regular contractions.  A low, dull backache. Summary  Your health care provider may recommend specific activity restrictions during pregnancy for a variety of reasons.  Activity restriction may require that you limit activities such as exercise, lifting, sex, or any other activity that requires great effort.  Discuss the risks and benefits of activity restriction with your health care team to decide if it is best for you and your baby.  Contact your health care provider right away if you think you are having contractions, or if you notice vaginal bleeding, discharge, or cramping. This information is not   intended to replace advice given to you by your health care provider. Make sure you discuss any questions you have with your health care provider. Document Revised: 10/06/2018 Document Reviewed: 05/05/2017 Elsevier Patient Education  2020 [employer] Vaginal Bleeding During Pregnancy,  Second Trimester  A small amount of bleeding (spotting) from the vagina is common during pregnancy. Sometimes the bleeding is normal and is not a sign of problems. In some other cases, it is a sign of something serious. Tell your doctor right away if there is any bleeding from your vagina. Follow these instructions at home: Activity  Follow your doctor's instructions about how active you can be.  If needed, make plans for someone to help with your normal activities.  Do not exercise or do activities that take a lot of effort until your doctor says that this is safe.  Do not lift anything that is heavier than 10 lb (4.5 kg) until your doctor says that this is safe.  Do not have sex or orgasms until your doctor says that this is safe. Medicines  Take over-the-counter and prescription medicines only as told by your doctor.  Do not take aspirin. It can cause bleeding. General instructions  Watch your condition for any changes.  Write down: ? The number of pads you use each day. ? How often you change pads. ? How soaked your pads are.  Do not use tampons.  Do not douche.  If you pass any tissue from your vagina, save it to show to your doctor.  Keep all follow-up visits as told by your doctor. This is important. Contact a doctor if:  You have bleeding in the vagina at any time during pregnancy.  You have cramps.  You have a fever that does not get better with medicine. Get help right away if:  You have very bad cramps in your back or belly (abdomen).  You have contractions.  You have chills.  You pass large clots or a lot of tissue from your vagina.  Your bleeding gets worse.  You feel light-headed.  You feel weak.  You pass out (faint).  You are leaking fluid from your vagina.  You have a gush of fluid from your vagina. Summary  Sometimes vaginal bleeding during pregnancy is normal and is not a problem. Sometimes it may be a sign of something  serious.  Tell your doctor about any bleeding from your vagina right away.  Follow your doctor's instructions about how active you can be. You may need someone to help you with your normal activities. This information is not intended to replace advice given to you by your health care provider. Make sure you discuss any questions you have with your health care provider. Document Revised: 05/04/2018 Document Reviewed: 04/16/2016 Elsevier Patient Education  2020 [employer].

## 2019-12-01 NOTE — MAU Note (Signed)
PT SAYS STARTED BLEEDING ON SAT- CONTINUED.  THEN TONIGHT WENT TO B-ROOM AT 540PM- SAW BLOOD CLO T SIZE OF AN APPLE . NO CRAMPS . NOW IN TRIAGE - SMALL SPOT OF RED BLOOD. PNC WITH [facility]-WAS THERE ON Tuesday- DR HORVATH-  LOOKED AT BLEEDING - GOT FHR. . IF WORSE - COME IN .  BLEEDING NOW IS SAME AS SAT - EXCEPT CLOT.

## 2019-12-02 LAB — CULTURE, OB URINE: Culture: <10000 — AB

## 2019-12-15 ENCOUNTER — Inpatient Hospital Stay (HOSPITAL_COMMUNITY)
Admission: AD | Admit: 2019-12-15 | Discharge: 2019-12-17 | DRG: 807 | Disposition: A | Discharge status: Home / Self Care | Payer: BC Managed Care – PPO | Attending: Obstetrics and Gynecology | Admitting: Obstetrics and Gynecology

## 2019-12-15 ENCOUNTER — Encounter (HOSPITAL_COMMUNITY): Payer: Self-pay | Admitting: Obstetrics

## 2019-12-15 ENCOUNTER — Other Ambulatory Visit: Payer: Self-pay

## 2019-12-15 ENCOUNTER — Inpatient Hospital Stay (HOSPITAL_BASED_OUTPATIENT_CLINIC_OR_DEPARTMENT_OTHER): Payer: BC Managed Care – PPO

## 2019-12-15 DIAGNOSIS — O4692 Antepartum hemorrhage, unspecified, second trimester: Secondary | ICD-10-CM

## 2019-12-15 DIAGNOSIS — Z3A18 18 weeks gestation of pregnancy: Secondary | ICD-10-CM | POA: Diagnosis not present

## 2019-12-15 DIAGNOSIS — O42919 Preterm premature rupture of membranes, unspecified as to length of time between rupture and onset of labor, unspecified trimester: Secondary | ICD-10-CM | POA: Diagnosis not present

## 2019-12-15 DIAGNOSIS — Z20822 Contact with and (suspected) exposure to covid-19: Secondary | ICD-10-CM | POA: Diagnosis present

## 2019-12-15 DIAGNOSIS — O42912 Preterm premature rupture of membranes, unspecified as to length of time between rupture and onset of labor, second trimester: Secondary | ICD-10-CM | POA: Diagnosis present

## 2019-12-15 DIAGNOSIS — O021 Missed abortion: Principal | ICD-10-CM | POA: Diagnosis present

## 2019-12-15 LAB — RESPIRATORY PANEL BY RT PCR (FLU A&B, COVID)
Influenza A by PCR: NEGATIVE
Influenza B by PCR: NEGATIVE
SARS Coronavirus 2 by RT PCR: NEGATIVE

## 2019-12-15 LAB — URINALYSIS, ROUTINE W REFLEX MICROSCOPIC
Bilirubin Urine: NEGATIVE
Glucose, UA: NEGATIVE mg/dL
Ketones, ur: NEGATIVE mg/dL
Nitrite: NEGATIVE
Protein, ur: NEGATIVE mg/dL
Specific Gravity, Urine: 1.024 (ref 1.005–1.030)
pH: 6 (ref 5.0–8.0)

## 2019-12-15 LAB — CBC
HCT: 33.7 % — ABNORMAL LOW (ref 36.0–46.0)
Hemoglobin: 11.2 g/dL — ABNORMAL LOW (ref 12.0–15.0)
MCH: 27.1 pg (ref 26.0–34.0)
MCHC: 33.2 g/dL (ref 30.0–36.0)
MCV: 81.4 fL (ref 80.0–100.0)
Platelets: 203 10*3/uL (ref 150–400)
RBC: 4.14 MIL/uL (ref 3.87–5.11)
RDW: 12.5 % (ref 11.5–15.5)
WBC: 12.9 10*3/uL — ABNORMAL HIGH (ref 4.0–10.5)
nRBC: 0 % (ref 0.0–0.2)

## 2019-12-15 LAB — WET PREP, GENITAL
Clue Cells Wet Prep HPF POC: NONE SEEN
Sperm: NONE SEEN
Trich, Wet Prep: NONE SEEN
Yeast Wet Prep HPF POC: NONE SEEN

## 2019-12-15 LAB — TYPE AND SCREEN
ABO/RH(D): A POS
Antibody Screen: NEGATIVE

## 2019-12-15 LAB — AMNISURE RUPTURE OF MEMBRANE (ROM) NOT AT ARMC: Amnisure ROM: POSITIVE

## 2019-12-15 MED ORDER — DOCUSATE SODIUM 100 MG PO CAPS
100.0000 mg | ORAL_CAPSULE | Freq: Every day | ORAL | Status: DC
  Administered 2019-12-16 – 2019-12-17 (×2): 100 mg via ORAL
  Filled 2019-12-15 (×2): qty 1

## 2019-12-15 MED ORDER — ZOLPIDEM TARTRATE 5 MG PO TABS: 5.0000 mg | ORAL_TABLET | Freq: Every evening | ORAL | Status: DC | PRN

## 2019-12-15 MED ORDER — PRENATAL MULTIVITAMIN CH
1.0000 | ORAL_TABLET | Freq: Every day | ORAL | Status: DC
  Administered 2019-12-16 – 2019-12-17 (×2): 1 via ORAL
  Filled 2019-12-15 (×2): qty 1

## 2019-12-15 MED ORDER — ACETAMINOPHEN 325 MG PO TABS: 650.0000 mg | ORAL_TABLET | ORAL | Status: DC | PRN

## 2019-12-15 MED ORDER — CALCIUM CARBONATE ANTACID 500 MG PO CHEW: 2.0000 | CHEWABLE_TABLET | ORAL | Status: DC | PRN

## 2019-12-15 NOTE — H&P (Signed)
29 y.o. G1P0000 @ [redacted]w[redacted]d presents to MAU with vaginal bleeding.   She was seen in MAU two weeks ago for vaginal bleeding.  At that time, limited US was normal with the exception of echogenic bowel.  She notes intermittent vaginal bleeding over the last two weeks.  It increased today, with clots and pelvic cramping.  She denies any gushes of clear fluid, fevers, chills, abnormal vaginal discharge.   In MAU, a limited US shows oligohydramnios with DVP 0.8cm.  On exam, she was noted to have a few small clots in the vagina but no active bleeding.  MAU provider reports visible membranes protruding through the external cervical os.  An amnisure was performed that returned positive.   Past Medical History:  Diagnosis Date  . Medical history non-contributory     Past Surgical History:  Procedure Laterality Date  . BREAST SURGERY     breast bx left breast    OB History  Gravida Para Term Preterm AB Living  1 0 0 0 0    SAB TAB Ectopic Multiple Live Births  0 0 0        # Outcome Date GA Lbr Len/2nd Weight Sex Delivery Anes PTL Lv  1 Current             Social History   Socioeconomic History  . Marital status: Single    Spouse name: Not on file  . Number of children: Not on file  . Years of education: Not on file  . Highest education level: Not on file  Occupational History  . Not on file  Tobacco Use  . Smoking status: Never Smoker  . Smokeless tobacco: Never Used  Vaping Use  . Vaping Use: Never used  Substance and Sexual Activity  . Alcohol use: Never  . Drug use: Never  . Sexual activity: Yes    Partners: Male    Comment: 1st intercourse- 89, partners- 3, current partner - 5 yrs  Other Topics Concern  . Not on file  Social History Narrative   ** Merged History Encounter **          Patient has no known allergies.    Prenatal Transfer Tool  Maternal Diabetes: too early Genetic Screening: Normal NIPT low risk Maternal Ultrasounds/Referrals: Echogenic bowel  At 16 week  [place] in MAU.  Plan was to re-evaluate at anatomy scan Maternal Substance Abuse:  No Significant Maternal Medications:  None Significant Maternal Lab Results: Other: + amnisure  ABO, Rh: --/--/A POS (11/18 0122) Antibody: NEG (11/18 0122) Rubella:  Immune RPR:   Negative HBsAg:   Negative HIV:   Negative GBS:   n/a    Vitals:   12/15/19 0043 12/15/19 0048  BP:  114/69  Pulse:  (!) 104  Resp:  20  Temp:  98.4 F (36.9 C)  SpO2: 98% 99%     General:  NAD Abdomen:  soft, gravid, mild fundal tenderness to deep palpation Ex:  no edema SSE: per MAU provider: "Cervix pink, external os appears dilated, shredded membranes protruding from the external os, several maroon quarter-sized clots in vaginal vault with minimal bright red blood, vaginal walls and external genitalia normal. No visible pooling." FHTs:  157   Lab Results  Component Value Date   WBC 12.9 (H) 12/15/2019   HGB 11.2 (L) 12/15/2019   HCT 33.7 (L) 12/15/2019   MCV 81.4 12/15/2019   PLT 203 12/15/2019     A/P   29 y.o. G1P0000 [redacted]w[redacted]d presents with  previable PPROM She has a mildly elevated WBC at 12.9 today (was also elevated two weeks ago at 13.0).  Mild fundal tenderness on exam, but otherwise well appearing.  We discussed that neonatal survival is higher with increasing gestational age.  We discussed the risks of maternal complications to include chorioamnionitis,endometritis, placental abruption and potential for resulting sepsis and hemorrhage.  Expectant management would require initial evaluation inpatient to monitor for labor, infection, bleeding followed by close follow up outpatient with planned readmission at [redacted]w[redacted]d should pregnancy continue. We discussed that if viability is reached, the risks of neonatal complications including pulmonary hypoplasia and limb contractures.  We discussed that given the potential life threatening complications that could occur with expectant management, proceeding with induction of  labor or D&E is also an option.   At this time, she and her partner are undecided how to proceed.  Will admit to antepartum.  They desire MFM and neonatology consult.  If they elect for expectant management, would consider starting latency antibiotics.  [facility] GEFFEL [employer]

## 2019-12-15 NOTE — MAU Note (Signed)
PT's significant other is here now and couple will discuss options for POC. Dr Chestine Spore had requested earlier when she saw pt if pt could stay in MAU until significant other came and couple could discuss options.

## 2019-12-15 NOTE — Consult Note (Signed)
Maternal Fetal Medicine Consultation   Requesting Provider: Marlow Baars, MD Reason for Request: Midtrimester premature rupture of membranes Date of Service 12/15/19  Ms. Allene Dillon is a 29 yo G1P0 at 37 w 1 is being seen as an inpatient for midtrimester premature rupture of membranes at the request of Dr. Chestine Spore.  Her pregnancy is complicated by vaginal bleeding for the last two weeks. She had an ultrasound also suggesting echogenic bowel.  Her exams include the following:   10/16 13w 3d with EDD of 05/16/20 11/06- 16w 1d- suspected marginal cord insertion MVP 3.5 seen for vaginal bleeding. Noted echogenic bowel and subchorionic hemorrhage 12/15/19- vaginal bleeding, dialated cervix and oligohydramnios with MVP of 0.6 cm\  She had a positive amnisure  The examination by Chestine Spore revealed a dilated cervix with membranes protruding from the internal os with minimal bleeding and clots.  Vitals with BMI 12/15/2019 12/15/2019 12/01/2019  Height - 5\' 2"  5\' 2"   Weight - 166 lbs 5 oz 165 lbs 14 oz  BMI - 30.41 30.34  Systolic 123 114  Diastolic 72 69 73  Pulse 90 104 89   OB History  Gravida Para Term Preterm AB Living  1 0 0 0 0 0  SAB TAB Ectopic Multiple Live Births  0 0 0 0 0    # Outcome Date GA Lbr Len/2nd Weight Sex Delivery Anes PTL Lv  1 Current            Past Medical History:  Diagnosis Date  . Medical history non-contributory    Past Surgical History:  Procedure Laterality Date  . BREAST SURGERY     breast bx left breast   Family History  Problem Relation Age of Onset  . Cancer Mother        pancreatic  . Diabetes Mother   . Diabetes Father   . Diabetes Maternal Grandmother   . Hyperlipidemia Maternal Grandmother   . Diabetes Maternal Grandfather   . Hyperlipidemia Maternal Grandfather   . Hyperlipidemia Paternal Grandmother   . Hyperlipidemia Paternal Grandfather    Social History   Socioeconomic History  . Marital status: Single    Spouse name: Not on  file  . Number of children: Not on file  . Years of education: Not on file  . Highest education level: Not on file  Occupational History  . Not on file  Tobacco Use  . Smoking status: Never Smoker  . Smokeless tobacco: Never Used  Vaping Use  . Vaping Use: Never used  Substance and Sexual Activity  . Alcohol use: Never  . Drug use: Never  . Sexual activity: Yes    Partners: Male    Comment: 1st intercourse- 89, partners- 3, current partner - 5 yrs  Other Topics Concern  . Not on file  Social History Narrative   ** Merged History Encounter **       Social Determinants of Health   Financial Resource Strain:   . Difficulty of Paying Living Expenses: Not on file  Food Insecurity:   . Worried About 263 in the Last Year: Not on file  . Ran Out of Food in the Last Year: Not on file  Transportation Needs:   . Lack of Transportation (Medical): Not on file  . Lack of Transportation (Non-Medical): Not on file  Physical Activity:   . Days of Exercise per Week: Not on file  . Minutes of Exercise per Session: Not on file  Stress:   . Feeling  of Stress : Not on file  Social Connections:   . Frequency of Communication with Friends and Family: Not on file  . Frequency of Social Gatherings with Friends and Family: Not on file  . Attends Religious Services: Not on file  . Active Member of Clubs or Organizations: Not on file  . Attends Banker Meetings: Not on file  . Marital Status: Not on file  Intimate Partner Violence:   . Fear of Current or Ex-Partner: Not on file  . Emotionally Abused: Not on file  . Physically Abused: Not on file  . Sexually Abused: Not on file          Current Facility-Administered Medications (Analgesics):  .  acetaminophen (TYLENOL) tablet 650 mg     Current Facility-Administered Medications (Other):  .  calcium carbonate (TUMS - dosed in mg elemental calcium) chewable tablet 400 mg of elemental calcium .  docusate  sodium (COLACE) capsule 100 mg .  prenatal multivitamin tablet 1 tablet .  zolpidem (AMBIEN) tablet 5 mg  No current outpatient medications on file.  No Known Allergies   Impression/Counseling:  I discussed with Ms. Torres the likelihood of premature rupture of membranes.   We reviewed the etiology, evaluation and management. I discussed that given her ongoing bleeding and subchorionic hemorrahage premature rupture of membranes in the midtrimester can be a complication of early pregnancy bleeding.  We discussed the ongoing risk to the pregnancy to include preterm labor, placental abruption, chorioamniotis and maternal sepsis. I discussed that no active management is recommended such as cerclage or tocolysis at this time. Antibiotic should be initiated if maternal fever presents, which is also an indication for delivery.  We discussed the option of inpatient expectant management vs induction of labor.We discussed that most outcomes include delivery within 3-5 days, however, if the uterus becomes quiesecnt some may continue pregnancy up to 4 weeks and longer.   We discussed the increased risk of early preterm delivery with decreased survival, pulmonary hypoplasia and increased risk for neurodevelopmental delay.  At this time I recommend continued observation given bleeding and mild cramping. If stable after 48-72 hours consider intensive outpatient management with counseling for labor and bleeding. Readmit at 22 6/7 days with betamethasone and antibiotics.   At this time Ms. Sherlon Handing and her husband were onsure about what they want to do. They were comfortable making more decisions after the 48-72 hours. In the mean time they will also meet with the NICU team.   I spent 45 minutes with > 50 % in face to face consultation and care coordination.  All questions answered.  I also discussed the plan of care with physician on call.   Novella Olive, MD.

## 2019-12-15 NOTE — MAU Note (Addendum)
Patient returns to MAU complaining of vaginal bleeding and occasional abdominal cramping. Patient reports she has had bleeding for about 2 weeks, states that this is not always constant and today it seized but once she came back from work noticed more blood and clots. Reports she has had to change her sanitary napkin 2 times in the past hour but does not fill the pad completely only changes it when she notices some blood.  She has been having 5/10 period-like cramps for the 2 weeks. This cramping comes and goes. None now.  FHR: doppler 175

## 2019-12-15 NOTE — Progress Notes (Signed)
Pt refused fetal doppler this morning.

## 2019-12-15 NOTE — Progress Notes (Signed)
Pt has refused fetal doppler. Pt denies any abdominal cramping and vaginal bleeding at this time. Carmelina Dane, RN

## 2019-12-15 NOTE — MAU Provider Note (Signed)
Chief Complaint: Vaginal Bleeding  SUBJECTIVE HPI: Laura [name] is a 29 y.o. G1P0000 at 8762w1d by 13 week ultrasound who presents to maternity admissions reporting concern of vaginal bleeding for several weeks. Pt presented today given concern of increased vaginal bleeding with clots in addition to cramping rated 5/10. Yesterday she saturated 3 pads. Pt was recently seen in [facility] on 12/01/19 for vaginal bleeding, at which time pt had an ultrasound without evidence of placental abruption or previa but did show subchorionic hemorrhage.  She denies vaginal bleeding, vaginal itching/burning, urinary symptoms, h/a, dizziness, n/v, or fever/chills.  Past Medical History:  Diagnosis Date  . Medical history non-contributory    Past Surgical History:  Procedure Laterality Date  . BREAST SURGERY     breast bx left breast   Social History   Socioeconomic History  . Marital status: Single    Spouse name: Not on file  . Number of children: Not on file  . Years of education: Not on file  . Highest education level: Not on file  Occupational History  . Not on file  Tobacco Use  . Smoking status: Never Smoker  . Smokeless tobacco: Never Used  Vaping Use  . Vaping Use: Never used  Substance and Sexual Activity  . Alcohol use: Never  . Drug use: Never  . Sexual activity: Yes    Partners: Male    Comment: 1st intercourse- [age], partners- 3, current partner - 5 yrs  Other Topics Concern  . Not on file  Social History Narrative   ** Merged History [name] **       Social Determinants of Health   Financial Resource Strain:   . Difficulty of Paying Living Expenses: Not on file  Food Insecurity:   . Worried About Programme researcher, broadcasting/film/videounning Out of Food in the Last Year: Not on file  . Ran Out of Food in the Last Year: Not on file  Transportation Needs:   . Lack of Transportation (Medical): Not on file  . Lack of Transportation (Non-Medical): Not on file  Physical Activity:   . Days of Exercise per  Week: Not on file  . Minutes of Exercise per Session: Not on file  Stress:   . Feeling of Stress : Not on file  Social Connections:   . Frequency of Communication with Friends and Family: Not on file  . Frequency of Social Gatherings with Friends and Family: Not on file  . Attends Religious Services: Not on file  . Active Member of Clubs or Organizations: Not on file  . Attends BankerClub or Organization Meetings: Not on file  . Marital Status: Not on file  Intimate Partner Violence:   . Fear of Current or Ex-Partner: Not on file  . Emotionally Abused: Not on file  . Physically Abused: Not on file  . Sexually Abused: Not on file   No current facility-administered medications on file prior to [name].   Current Outpatient Medications on File Prior to [name]  Medication Sig Dispense Refill  . folic acid (FOLVITE) 1 MG tablet Take 1 mg by mouth daily.    . Prenatal Vit-Fe Fumarate-FA (PRENATAL MULTIVITAMIN) TABS tablet Take 1 tablet by mouth daily at 12 noon.    . Prenatal Vit-Fe Fumarate-FA (PRENATAL MULTIVITAMIN) TABS tablet Take 1 tablet by mouth daily at 12 noon.     No Known Allergies  ROS:  Review of Systems  Constitutional: Negative for chills and fever.  HENT: Negative for sore throat.   Eyes: Negative for photophobia  and visual disturbance.  Respiratory: Negative for cough and shortness of breath.   Cardiovascular: Negative for chest pain.  Gastrointestinal: Positive for abdominal pain. Negative for nausea and vomiting.  Genitourinary: Positive for vaginal bleeding. Negative for dysuria, vaginal discharge and vaginal pain.  Musculoskeletal: Negative for back pain.  Neurological: Negative for light-headedness and headaches.   I have reviewed patient's Past Medical Hx, Surgical Hx, Family Hx, Social Hx, medications and allergies.   Physical Exam   Patient Vitals for the past 24 hrs:  BP Temp Temp src Pulse Resp SpO2 Height Weight  12/15/19 0048 114/69 98.4 F (36.9 C)  Oral (!) 104 20 99 % 5\' 2"  (1.575 m) 75.4 kg  12/15/19 0043 -- -- -- -- -- 98 % -- --   Constitutional: Well-developed, well-nourished female in no acute distress.  Cardiovascular: normal rate Respiratory: normal effort GI: Abd soft, non-tender. Pos BS x 4 MS: Extremities nontender, no edema, normal ROM Neurologic: Alert and oriented x 4. GU: Neg CVAT.  PELVIC EXAM: Cervix pink, external os appears dilated, shredded membranes protruding from the external os, several maroon quarter-sized clots in vaginal vault with minimal bright red blood, vaginal walls and external genitalia normal. No visible pooling.  Bimanual exam: deferred  FHT 157 on formal ultrasound.  LAB RESULTS Results for orders placed or performed during the hospital [name] of 12/15/19 (from the past 24 hour(s))  [name]     Status: Abnormal   Collection Time: 12/15/19  1:22 AM  Result Value Ref Range   WBC 12.9 (H) 4.0 - 10.5 K/uL   RBC 4.14 3.87 - 5.11 MIL/uL   Hemoglobin 11.2 (L) 12.0 - 15.0 g/dL   HCT 12/17/19 (L) 36 - 46 %   MCV 81.4 80.0 - 100.0 fL   MCH 27.1 26.0 - 34.0 pg   MCHC 33.2 30.0 - 36.0 g/dL   RDW 16.6 06.3 - 01.6 %   Platelets 203 150 - 400 K/uL   nRBC 0.0 0.0 - 0.2 %  Type and screen [name]     Status: None   Collection Time: 12/15/19  1:22 AM  Result Value Ref Range   ABO/RH(D) A POS    Antibody Screen NEG    Sample Expiration      12/18/2019,2359 Performed at [facility], [address] [place], [place]   Laura [name], Laura [name]     Status: Abnormal   Collection Time: 12/15/19  4:27 AM   Specimen: PATH Cytology Cervicovaginal Ancillary Only  Result Value Ref Range   Yeast [name] HPF POC NONE SEEN NONE SEEN   Trich, [name] NONE SEEN NONE SEEN   Clue Cells [name] HPF POC NONE SEEN NONE SEEN   WBC, [name] HPF POC MODERATE (A) NONE SEEN   Sperm NONE SEEN     --/--/A POS (11/18 0122)  IMAGING 03-21-1985 MFM OB [name]  Result Date:  12/03/2019 ----------------------------------------------------------------------  OBSTETRICS REPORT                       (Signed Final 12/03/2019 12:18 pm) ---------------------------------------------------------------------- Patient Info  ID #:       13/06/2019                          D.O.B.:  11-May-1990 (29 yrs)  Name:       Laura [name]  Visit Date: 12/01/2019 10:22 pm              [name] ---------------------------------------------------------------------- Performed By  Attending:        Noralee Space MD        Ref. Address:     [address]                                                             [place], [place]                                                             [address]  Performed By:     [facility] [name]          [facility]  Referred By:      Laura [name] CNM ---------------------------------------------------------------------- Orders  #  Description                           Code        Ordered By  1  Laura [name]                     92330.07    Laura [name] ----------------------------------------------------------------------  #  Order #                     Accession #                Episode #  1  [id number]                   [id number]                 [id number] ---------------------------------------------------------------------- Indications  Vaginal bleeding in pregnancy, second          O46.92  trimester  [redacted] weeks gestation of pregnancy                Z3A.16 ---------------------------------------------------------------------- Fetal Evaluation  Num Of Fetuses:         1  Fetal Heart Rate(bpm):  171  Cardiac Activity:       Observed  Presentation:           Cephalic  Placenta:               Anterior  P. Cord Insertion:      Marginal insertion  Amniotic Fluid  AFI FV:      Within  normal limits                              Largest Pocket(cm)                              3.5  Comment:    No placental abruption or previa identified. ---------------------------------------------------------------------- OB History  Gravidity:    1         Term:   0        Prem:   0        SAB:   0  TOP:          0       Ectopic:  0        Living: 0 ---------------------------------------------------------------------- Gestational Age  Best:          16w 1d     Det. By:  Laura Dubs         EDD:   05/16/20 ---------------------------------------------------------------------- Anatomy  Stomach:               Appears normal, left   Kidneys:                Appear normal                         sided  Abdomen:               Echogenic Bowel        Bladder:                Appears normal ---------------------------------------------------------------------- Cervix Uterus Adnexa  Cervix  Length:           3.28  cm.  Normal appearance by transabdominal scan.  Uterus  No abnormality visualized.  Right Ovary  Not visualized. No adnexal mass visualized.  Left Ovary  Within normal limits. Small corpus luteum noted.  Cul De Sac  No free fluid seen.  Adnexa  No abnormality visualized. ---------------------------------------------------------------------- Impression  Patient was evaluated for c/o vaginal bleeding .  A [name] ultrasound study was performed .Amniotic fluid is  normal and good fetal activity is seen .Placenta appears  normal with subchorionic hemorrhage. Fetal bowel appears  hyperechogenic and could be from vaginal bleeding.  However, detailed fetal anatomical survey is recommended in  2 to 3 weeks to evaluate the anatomy.  Marginal cord insertion cannot be ruled out.  On transabdominal scan, the cervix looks long and closed . ---------------------------------------------------------------------- Recommendations  Fetal anatomy in 2 to 3 weeks.  ----------------------------------------------------------------------                  Laura Space, MD Electronically Signed Final Report   12/03/2019 12:18 pm ----------------------------------------------------------------------   Laura [name]  Procedures  . Laura [name], Laura [name]  . Laura [name]  . Laura [name]  . Laura [name], Laura [name]  . Laura rupture of membrane (rom)not at [facility]  . Type and screen [facility]    No orders of the defined types were placed in this [name].   Consulted Dr. Chestine Spore. Treatments in Laura included:  none.  ASSESSMENT & PLAN: 1. Vaginal bleeding in pregnancy, second trimester: Pt is a 29yo G1P0 at [redacted]w[redacted]d by 13 week ultrasound who presents given concern of worsening vaginal bleeding and associated abdominal cramping. Rh positive. H&H stable at 11.2 & 33.7. Sterile spec exam notable for slightly dilated external os with shredded membranes protruding and several maroon quarter-sized clots in vaginal vault with minimal bright red blood. Prior ultrasound on 11/4 without evidence of placental abruption or previa but was notable for subchorionic hemorrhage. Formal ultrasound today showing IUP with +FHTs but also notable for marginal cord insertion, severe oligohydramnios (largest pocket 0.8cm) and cervical dilation. Laura positive. Laura [name] unremarkable. GC/Chlamydia pending. -after discussion with Dr. Chestine Spore, will plan for admission given threatened vs inevitable second trimester pregnancy loss -Dr. Chestine Spore will promptly come to [facility] to discuss options with pt   Mykal Batiz, Skipper Cliche, MD OB Fellow, Faculty Practice 12/15/2019 4:54 AM

## 2019-12-15 NOTE — Consult Note (Signed)
Asked by Dr. Chestine Spore to provide prenatal consultation for 29 y.o. G1P0000 who is now [redacted]w[redacted]d EGA with vaginal bleeding, ROM, limited US showing oligohydramnios and echogenic bowel. Current plan to evaluate in patient x48 hours for signs of labor. Neonatology consult requested by family to discuss options. Mother and father currently undecided as they are waiting the 48 hours to determine how to proceed with pregnancy. General information initially discussing purpose of meeting, NICU, DNP project, and continued follow until delivery.  Today I discussed with patient and FOB about usual expectations for preterm infants at [redacted] weeks gestation, including needs for respiratory support in the setting of hypoplastic lungs/ oligohydramnios and high risk for infection given ROM and the need for antibiotics. Discussed outcomes and survival were poor but did not provide NICHD estimate tool of survival rates for normally grown infants. Alternatively, discussed we would continue to re-evaluate survival/ outcomes closer to definite delivery gestation or following 48 hours if parents decide to continue with pregnancy. I told them that their daughter "Laura [name]" would be at risk for various complications should she be delivered at 22-[redacted] weeks gestation. Discussed initial NICU course should she survive delivery: need for intubation/ breathing tube, on respirator, IVs, medications, on monitors.  I also expressed to family currently this hospital does not offer resuscitation prior to 23 weeks and should they continue with pregnancy and mom goes into labor between 22weeks - 22w6 days she would need to discuss with OB alternative hospital to deliver that accomodates extreme preterm delivery at [redacted] weeks gestation.    Parents have been receptive to information provided and appeared to understand and asked minimal questions. They both expressed appreciation.  Plan: Follow up within 24 hours and prior to discharge. Provide resources specific  to NICU and extreme prematurity. Should pregnancy continue- continued weekly follow up to discuss delivery management and NICU care of their daughter "Laura [name]".    Thank you for consult and support of DNP Scholarly project. Windell Moment, RNC-NIC, NNP-BC 12/15/2019

## 2019-12-16 DIAGNOSIS — O42919 Preterm premature rupture of membranes, unspecified as to length of time between rupture and onset of labor, unspecified trimester: Secondary | ICD-10-CM | POA: Diagnosis present

## 2019-12-16 DIAGNOSIS — O021 Missed abortion: Secondary | ICD-10-CM | POA: Diagnosis present

## 2019-12-16 DIAGNOSIS — O42912 Preterm premature rupture of membranes, unspecified as to length of time between rupture and onset of labor, second trimester: Secondary | ICD-10-CM | POA: Diagnosis present

## 2019-12-16 DIAGNOSIS — Z20822 Contact with and (suspected) exposure to covid-19: Secondary | ICD-10-CM | POA: Diagnosis present

## 2019-12-16 DIAGNOSIS — O209 Hemorrhage in early pregnancy, unspecified: Secondary | ICD-10-CM | POA: Diagnosis present

## 2019-12-16 DIAGNOSIS — Z3A18 18 weeks gestation of pregnancy: Secondary | ICD-10-CM | POA: Diagnosis not present

## 2019-12-16 LAB — GC/CHLAMYDIA PROBE AMP (~~LOC~~) NOT AT ARMC
Chlamydia: NEGATIVE
Comment: NEGATIVE
Comment: NORMAL
Neisseria Gonorrhea: NEGATIVE

## 2019-12-16 NOTE — Progress Notes (Signed)
Laura [name] 29 y.o. G1P0000 at [redacted]w[redacted]d HD#2 admitted with PPROM S: She has no complaints this morning. States very minimal vaginal spotting and denies abdominal pain. No fevers or chills.   O: Vitals:   12/15/19 2349 12/16/19 0559 12/16/19 0600 12/16/19 0805  BP:  118/60 118/60 123/62  Pulse: 79 91 94 99  Resp:  17  18  Temp:  98.5 F (36.9 C)    TempSrc:  Oral    SpO2:  95%  99%  Weight:      Height:       Has been refusing FH checks.   A/p: Laura [name] 29 y.o. G1P0000 at [redacted]w[redacted]d HD#2 admitted with PPROM - S/p MFM and NICU consults.  - We reviewed her discussion with MFM yesterday and options of expectant management with outpatient monitoring, induction of labor, D&E. She had expressed yesterday that she wanted to wait 48-72 hours before deciding as she was feeling overwhelmed. She continues to state she wants to wait until tomorrow to decide. She had some questions about pain management if she chooses IOL so we reviewed pain management options. All questions answered.  - Continue to monitor for labor, bleeding, infection.       Charlett Nose 12/16/19 10:22 AM

## 2019-12-16 NOTE — Progress Notes (Signed)
Pt refused fetal doppler. Pt denies any vaginal bleeding at this time. Pt denies any abdominal pain.

## 2019-12-16 NOTE — Consult Note (Signed)
Follow up with Mom and FOB today - both denied any further questions/concerns about previous discussion and consult. Denied further counseling as parents explained mom would "take the pill tomorrow" to induce labor. Parents declined additional resources/ counseling support. Expressed my condolences for their difficult decision and loss. Thanked them for their time and encouraged to reach out or contact if any future concerns or questions arise.   Updated neonatal attending on-call Dr. Eric Form.   Windell Moment, RNC-NIC, NNP-BC 12/16/2019

## 2019-12-17 MED ORDER — ONDANSETRON HCL 4 MG PO TABS: 4.0000 mg | ORAL_TABLET | ORAL | Status: DC | PRN

## 2019-12-17 MED ORDER — IBUPROFEN 800 MG PO TABS
800.0000 mg | ORAL_TABLET | Freq: Three times a day (TID) | ORAL | Status: DC
  Administered 2019-12-17: 800 mg via ORAL
  Filled 2019-12-17: qty 1

## 2019-12-17 MED ORDER — ONDANSETRON HCL 4 MG/2ML IJ SOLN: 4.0000 mg | INTRAMUSCULAR | Status: DC | PRN

## 2019-12-17 MED ORDER — SIMETHICONE 80 MG PO CHEW: 80.0000 mg | CHEWABLE_TABLET | ORAL | Status: DC | PRN

## 2019-12-17 MED ORDER — ACETAMINOPHEN 325 MG PO TABS: 650.0000 mg | ORAL_TABLET | ORAL | Status: DC | PRN

## 2019-12-17 MED ORDER — OXYTOCIN-SODIUM CHLORIDE 30-0.9 UT/500ML-% IV SOLN
INTRAVENOUS | Status: AC
  Filled 2019-12-17: qty 500

## 2019-12-17 MED ORDER — DIBUCAINE (PERIANAL) 1 % EX OINT: 1.0000 "application " | TOPICAL_OINTMENT | CUTANEOUS | Status: DC | PRN

## 2019-12-17 MED ORDER — MAGNESIUM HYDROXIDE 400 MG/5ML PO SUSP: 30.0000 mL | ORAL | Status: DC | PRN

## 2019-12-17 MED ORDER — MEASLES, MUMPS & RUBELLA VAC IJ SOLR: 0.5000 mL | Freq: Once | INTRAMUSCULAR | Status: DC

## 2019-12-17 MED ORDER — IBUPROFEN 800 MG PO TABS
800.0000 mg | ORAL_TABLET | Freq: Three times a day (TID) | ORAL | 0 refills | Status: DC
Start: 2019-12-17 — End: 2022-01-06

## 2019-12-17 MED ORDER — PRENATAL MULTIVITAMIN CH: 1.0000 | ORAL_TABLET | Freq: Every day | ORAL | Status: DC

## 2019-12-17 MED ORDER — SODIUM CHLORIDE 0.9 % IV SOLN: 250.0000 mL | INTRAVENOUS | Status: DC | PRN

## 2019-12-17 MED ORDER — BUTORPHANOL TARTRATE 1 MG/ML IJ SOLN
1.0000 mg | INTRAMUSCULAR | Status: DC | PRN
  Administered 2019-12-17 (×2): 1 mg via INTRAVENOUS
  Filled 2019-12-17 (×2): qty 1

## 2019-12-17 MED ORDER — METHYLERGONOVINE MALEATE 0.2 MG/ML IJ SOLN: 0.2000 mg | INTRAMUSCULAR | Status: DC | PRN

## 2019-12-17 MED ORDER — DIPHENHYDRAMINE HCL 25 MG PO CAPS: 25.0000 mg | ORAL_CAPSULE | Freq: Four times a day (QID) | ORAL | Status: DC | PRN

## 2019-12-17 MED ORDER — SENNOSIDES-DOCUSATE SODIUM 8.6-50 MG PO TABS: 2.0000 | ORAL_TABLET | ORAL | Status: DC

## 2019-12-17 MED ORDER — TETANUS-DIPHTH-ACELL PERTUSSIS 5-2.5-18.5 LF-MCG/0.5 IM SUSY: 0.5000 mL | PREFILLED_SYRINGE | Freq: Once | INTRAMUSCULAR | Status: DC

## 2019-12-17 MED ORDER — ZOLPIDEM TARTRATE 5 MG PO TABS: 5.0000 mg | ORAL_TABLET | Freq: Every evening | ORAL | Status: DC | PRN

## 2019-12-17 MED ORDER — WITCH HAZEL-GLYCERIN EX PADS: 1.0000 "application " | MEDICATED_PAD | CUTANEOUS | Status: DC | PRN

## 2019-12-17 MED ORDER — SODIUM CHLORIDE 0.9% FLUSH: 3.0000 mL | INTRAVENOUS | Status: DC | PRN

## 2019-12-17 MED ORDER — METHYLERGONOVINE MALEATE 0.2 MG PO TABS: 0.2000 mg | ORAL_TABLET | ORAL | Status: DC | PRN

## 2019-12-17 MED ORDER — FERROUS SULFATE 325 (65 FE) MG PO TABS
325.0000 mg | ORAL_TABLET | Freq: Two times a day (BID) | ORAL | Status: DC
  Administered 2019-12-17: 325 mg via ORAL
  Filled 2019-12-17: qty 1

## 2019-12-17 MED ORDER — SODIUM CHLORIDE 0.9% FLUSH: 3.0000 mL | Freq: Two times a day (BID) | INTRAVENOUS | Status: DC

## 2019-12-17 MED ORDER — COCONUT OIL OIL: 1.0000 "application " | TOPICAL_OIL | Status: DC | PRN

## 2019-12-17 MED ORDER — MISOPROSTOL 200 MCG PO TABS
800.0000 ug | ORAL_TABLET | Freq: Four times a day (QID) | ORAL | Status: DC
  Administered 2019-12-17 (×2): 800 ug via VAGINAL
  Filled 2019-12-17 (×2): qty 4

## 2019-12-17 MED ORDER — OXYCODONE-ACETAMINOPHEN 5-325 MG PO TABS: 2.0000 | ORAL_TABLET | ORAL | Status: DC | PRN

## 2019-12-17 MED ORDER — BENZOCAINE-MENTHOL 20-0.5 % EX AERO: 1.0000 "application " | INHALATION_SPRAY | CUTANEOUS | Status: DC | PRN

## 2019-12-17 NOTE — Progress Notes (Signed)
Pitocin and cytotec administered to aid in placenta delivery.   Expectant management for now.

## 2019-12-17 NOTE — Discharge Summary (Signed)
Postpartum Discharge Summary  Date of Service updated      Patient Name: Laura [name] DOB: [birth year] MRN: [id number]  Date of admission: 12/15/2019 Delivery date:12/17/2019  Delivering provider: Barbaraann Cao Philis Pique Date of discharge: 12/17/2019  Admitting diagnosis: Preterm premature rupture of membranes (PPROM) with unknown onset of labor [O42.919] Preterm premature rupture of membranes [O42.919] Intrauterine pregnancy: [redacted]w[redacted]d     Secondary diagnosis:  Active Problems:   Preterm premature rupture of membranes (PPROM) with unknown onset of labor   Preterm premature rupture of membranes  Additional problems: none noted    Discharge diagnosis: Preterm Pregnancy Delivered and 18 week premature rupture of membranes                                              Post partum procedures:none Augmentation: Pitocin and Cytotec Complications: [facility] course: Induction of Labor With Vaginal Delivery   29 y.o. yo G1P0000 at [redacted]w[redacted]d was admitted to the hospital 12/15/2019 for induction of labor.  Indication for induction: premature rupture of membranes with no fluid around fetus on admission..  Patient had an uncomplicated labor course as follows: Membrane Rupture Time/Date:  ,   Delivery Method:Vaginal, Spontaneous  Episiotomy: None  Lacerations:  None  Details of delivery can be found in separate delivery note.  Patient had a routine postpartum course. Patient is discharged home 12/17/19.  Newborn Data: Birth date:12/17/2019  Birth time:3:10 PM  Gender:Female  Living status:Fetal Demise  Apgars:0 ,0  Weight:   Magnesium Sulfate received: No BMZ received: No Rhophylac:No MMR:No T-DaP:Given prenatally Flu: No Transfusion:No  Physical exam  Vitals:   12/17/19 1545 12/17/19 1600 12/17/19 1615 12/17/19 1630  BP: 126/75 115/71 119/65 116/65  Pulse: 96 97 89 97  Resp:      Temp:      TempSrc:      SpO2:      Weight:      Height:         Labs: Lab Results  Component Value Date   WBC 12.9 (H) 12/15/2019   HGB 11.2 (L) 12/15/2019   HCT 33.7 (L) 12/15/2019   MCV 81.4 12/15/2019   PLT 203 12/15/2019   No flowsheet data found. Edinburgh Score: No flowsheet data found.    After visit meds:  Allergies as of 12/17/2019   No Known Allergies     Medication List    TAKE these medications   folic acid 1 MG tablet Commonly known as: FOLVITE Take 1 mg by mouth daily.   ibuprofen 800 MG tablet Commonly known as: ADVIL Take 1 tablet (800 mg total) by mouth 3 (three) times daily.   prenatal multivitamin Tabs tablet Take 1 tablet by mouth daily at 12 noon. What changed: Another medication with the same name was removed. Continue taking this medication, and follow the directions you see here.            Discharge Care Instructions  (From admission, onward)         Start     Ordered   12/17/19 0000  Discharge wound care:       Comments: Sitz baths and icepacks to perineum.  If stitches, they will dissolve.   12/17/19 1712           Discharge home in stable condition  Activity: Advance as tolerated. Pelvic  rest for 6 weeks.  Diet: routine diet Anticipated Birth Control: Unsure Postpartum Appointment:4 weeks Additional Postpartum F/U: none Future Appointments:No future appointments. Follow up Visit:  Follow-up Information    Bobbye Charleston, MD Follow up in 4 week(s).   Specialty: Obstetrics and Gynecology Contact information: [address] [place] [address] [phone]                   12/17/2019 Daria Pastures, MD

## 2019-12-17 NOTE — Progress Notes (Signed)
Pt refusing doppler at this time.

## 2019-12-17 NOTE — Progress Notes (Signed)
Pt refused autopsy at this time due to size of fetus. Placenta to be sent to pathology and Anora test to be sent on Monday.

## 2019-12-17 NOTE — Progress Notes (Signed)
29 y.o. G1P0000 [redacted]w[redacted]d HD#1 admitted for Preterm premature rupture of membranes (PPROM) with unknown onset of labor [O42.919] Preterm premature rupture of membranes [O42.919].  Pt currently stable with no c/o.    Vitals:   12/16/19 1925 12/16/19 2337 12/17/19 0457 12/17/19 0741  BP:  107/68 102/62 112/65  Pulse:  84 93 91  Resp:  16 16 17   Temp:  98.2 F (36.8 C) 98.1 F (36.7 C) 98.1 F (36.7 C)  TempSrc:  Oral Oral Oral  SpO2: 97% 100% 99% 98%  Weight:      Height:        Lungs CTA Cor RRR Abd  Soft, gravid, nontender Ex SCDs FHTs  - declined by mother   No results found for this or any previous visit (from the past 24 hour(s)).  A:  HD#1  [redacted]w[redacted]d with probable PPROM.  Yesterday: A/p: [redacted]w[redacted]d 29 y.o. G1P0000 at [redacted]w[redacted]d HD#2 admitted with PPROM - S/p MFM and NICU consults.  - We reviewed her discussion with MFM yesterday and options of expectant management with outpatient monitoring, induction of labor, D&E. She had expressed yesterday that she wanted to wait 48-72 hours before deciding as she was feeling overwhelmed. She continues to state she wants to wait until tomorrow to decide. She had some questions about pain management if she chooses IOL so we reviewed pain management options. All questions answered.  - Continue to monitor for labor, bleeding, infection.   Today: I discussed with mother proceeding with induction since starting labor on own is unlikely and all available evidence is this is not a pregnancy that will survive.  Pt agrees to proceed with cytotec.  A POS.  H/H was 11/33.  No current signs of infection.  12/33

## 2019-12-17 NOTE — Progress Notes (Signed)
Placenta delivered intact per nurse.  Pt not bleeding except for normal lochia.  Pt desires chromosomes and autopsy of fetus.  She also desires to go home tonight.  Will d/c.

## 2019-12-17 NOTE — Progress Notes (Signed)
Pt discharged after discharge instructions given. All questions answered. IV discontinued. Pt sent home in stable condition with all belongings.

## 2019-12-20 LAB — SURGICAL PATHOLOGY

## 2020-01-30 ENCOUNTER — Other Ambulatory Visit: Payer: Self-pay

## 2020-01-30 ENCOUNTER — Ambulatory Visit: Payer: BC Managed Care – PPO | Attending: Obstetrics and Gynecology | Admitting: Obstetrics and Gynecology

## 2020-01-30 DIAGNOSIS — O09299 Supervision of pregnancy with other poor reproductive or obstetric history, unspecified trimester: Secondary | ICD-10-CM | POA: Diagnosis not present

## 2020-01-30 NOTE — Progress Notes (Signed)
Maternal-Fetal Medicine   Name: Laura [name] DOB: [birth year] MRN: [id number] Referring Provider: Derl Barrow, MD  I had the pleasure of seeing Ms. Eloyce Bultman today at [facility]. She is G1 P0010 and is here for preconception consultation because of her history of 18-week pregnancy loss in November 2020.  Patient has been having vaginal bleeding since 13 weeks' gestation.  She was evaluated at the MAU at 13 weeks and at 16 weeks of complaints of vaginal bleeding.  On transabdominal scan, at 16 weeks, the cervix measured 3.3 cm.  At [redacted] weeks gestation, she had heavy vaginal bleeding that prompted her to come to the MAU. She had no fever or abdominal pain. She had minimal abdominal cramping.  On ultrasound, severe oligohydramnios with a maximum vertical pocket of amniotic fluid measuring 6 mm was seen.  The cervix measures 1.6 cm.  Marginal cord insertion was seen on ultrasound (not confirmed on placental histology).  Patient was admitted to [facility] specialty care and had MFM consultation.  She was also counseled by her team on the possible risks including maternal sepsis.  2 days later, the patient opted to have termination of pregnancy.  Following vaginal misoprostol, she delivered a nonviable female infant (12/17/19).  She had uneventful postpartum recovery.  Placental examination showed acute chorioamnionitis.  Placenta weight 129 g.  No marginal cord insertion was seen.  GYN history: History of abnormal Pap smears at age 14 and the patient reported she had cervical biopsy.  No history of cervical surgeries (LEEP or Conization).  She gives history of breast biopsy that was benign.  Patient has not resumed her periods.  Past medical history: No history of hypertension or diabetes or any other chronic medical conditions. Past surgical history: Breast biopsy. Allergies: No known drug allergies. Medications: Prenatal vitamins, folic acid. Family history:  No history of venous thromboembolism in the family. Social history: Denies tobacco drug or alcohol use.  She lives with a partner who is Latino and he is in good health.  Patient works in a pharmacy.  Immunization: Patient received 2 doses of Covid vaccine.  Preterm premature rupture of membranes (PPROM): -I discussed the causes of PPROM including vaginal bleeding, intraamniotic infection and cervical insufficiency. -Patient has been having intermittent vaginal bleeding since early pregnancy. At 16 weeks, gestation, the cervix measured within normal range. At 18 weeks, when she had PPROM, the cervix measured 1.6 cm. -I explained that PPROM leads to spontaneous delivery in about 50% of cases in early pregnancy and that cervical shortening could be from vaginal bleeding leading to miscarriage.  -Although cervical insufficiency was unlikely, I explained and recommended serial cervical length measurements from 16 weeks' gestation in her next pregnancy. If cervical insufficiency is seen, we would recommend rescue cerclage. -She had no clearly identifiable causes (other than vaginal bleeding) for PPROM. -Patient is not using contraception. I advised her to wait till her periods return before attempting pregnancy. -I do not recommend progesterone prophylaxis (17-OHP) in her next pregnancy as her pregnancy loss was at less than 20 weeks' gestation.   Preconception -I encouraged her to take prenatal vitamins or folic acid that reduces the likelihood of developing fetal open-neural tube defects. -Patient was counseled that booster dose of COVID-19 vaccine is safe.  Recommendations: -Preconception folic acid to continue. -Serial cervical length measurements in her next pregnancy at 16/18/20/22 weeks' gestation to identify cervical insufficiency. -Progesterone prophylaxis (17-OHP) is not recommended. -Transvaginal ultrasound (before conception) may be of value in  identifying intracavitary lesions (not  counseled).  Thank you for consultation. If you have any questions or concerns, please contact me at the [facility]. Consultation including face-to-face counseling 30 minutes.

## 2020-03-12 NOTE — Progress Notes (Signed)
[facility]    Provider:  Dr Lucia Gaskins Requesting Provider: Gordy Clement, PA-C Primary Care Provider:  Gordy Clement, PA-C  CC:  Bell's palsy  HPI:  Laura [name] is a 30 y.o. female here as requested by Cherre Huger, Side M, PA-C for Bell's palsy. No significant past medical history. Started in December acutely, no numbness, her face is weak, a lot better, she was given treatment for her eyes, she went to a clinic [facility]. It has improved, she feels her right eye hurts, no vision problems. 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 She had a cold before this happened. No dry eyes, no hearing problems, slowly getting better. No weakness, no numbness or tingling. She still has difficulty closing eye all the way and weakness of smile and eyebrow lift but slowly improved a lot.    Review of Systems: Patient complains of symptoms per HPI as well as the following symptoms: facial weakness. Pertinent negatives and positives per HPI. All others negative.   Social History   Socioeconomic History   Marital status: Single    Spouse name: Not on file   Number of children: Not on file   Years of education: Not on file   Highest education level: Not on file  Occupational History   Not on file  Tobacco Use   Smoking status: Never Smoker   Smokeless tobacco: Never Used  Vaping Use   Vaping Use: Never used  Substance and Sexual Activity   Alcohol use: Never   Drug use: Never   Sexual activity: Yes    Partners: Male    Comment: 1st intercourse- 42, partners- 3, current partner - 5 yrs  Other Topics Concern   Not on file  Social History Narrative   Lives with other   Right handed   Caffeine: maybe 2 cups/day (includes energy drinks)      ** Merged History Encounter **       Social Determinants of Health   Financial Resource Strain: Not on file  Food Insecurity: Not on file   Transportation Needs: Not on file  Physical Activity: Not on file  Stress: Not on file  Social Connections: Not on file  Intimate Partner Violence: Not on file    Family History  Problem Relation Age of Onset   Cancer Mother        pancreatic   Diabetes Mother    Diabetes Father    Diabetes Maternal Grandmother    Hyperlipidemia Maternal Grandmother    Diabetes Maternal Grandfather    Hyperlipidemia Maternal Grandfather    Hyperlipidemia Paternal Grandmother    Hyperlipidemia Paternal Grandfather     Past Medical History:  Diagnosis Date   Medical history non-contributory     Patient Active Problem List   Diagnosis Date Noted   Preterm premature rupture of membranes 12/16/2019   Preterm premature rupture of membranes (PPROM) with unknown onset of labor 12/15/2019    Past Surgical History:  Procedure Laterality Date   BREAST SURGERY     breast bx left breast    Current Outpatient Medications  Medication Sig Dispense Refill   folic acid (FOLVITE) 1 MG tablet Take 1 mg by mouth daily.     ibuprofen (ADVIL) 800 MG tablet Take 1 tablet (800 mg total) by mouth 3 (three) times daily. 30 tablet 0   Prenatal Vit-Fe Fumarate-FA (PRENATAL MULTIVITAMIN) TABS tablet  Take 1 tablet by mouth daily at 12 noon.     No current facility-administered medications for this visit.    Allergies as of 03/13/2020   (No Known Allergies)    Vitals: BP 105/69 (BP Location: Right Arm, Patient Position: Sitting)    Pulse 78    Ht 5\' 2"  (1.575 m)    Wt 175 lb (79.4 kg)    LMP 03/09/2020    Breastfeeding No    BMI 32.01 kg/m  Last Weight:  Wt Readings from Last 1 Encounters:  03/13/20 175 lb (79.4 kg)   Last Height:   Ht Readings from Last 1 Encounters:  03/13/20 5\' 2"  (1.575 m)     Physical exam: Exam: Gen: NAD, conversant, well nourised, well groomed                     CV: RRR, no MRG. No Carotid Bruits. No peripheral edema, warm, nontender Eyes: Conjunctivae  clear without exudates or hemorrhage  Neuro: Detailed Neurologic Exam  Speech:    Speech is normal; fluent and spontaneous with normal comprehension.  Cognition:    The patient is oriented to person, place, and time;     recent and remote memory intact;     language fluent;     normal attention, concentration,     fund of knowledge Cranial Nerves:    The pupils are equal, round, and reactive to light. The fundi are flat. Visual fields are full to finger confrontation. Extraocular movements are intact. Trigeminal sensation is intact and the muscles of mastication are normal. Right decreased eye closure strenth with bell's phenomenon, right lower and upper facial weakness,. The palate elevates in the midline. Hearing intact. Voice is normal. Shoulder shrug is normal. The tongue has normal motion without fasciculations.   Coordination:    Normal finger to nose  Gait:    Normal native gait  Motor Observation:    No asymmetry, no atrophy, and no involuntary movements noted. Tone:    Normal muscle tone.    Posture:    Posture is normal. normal erect    Strength:    Strength is V/V in the upper and lower limbs.      Sensation: intact to LT     Reflex Exam:  DTR's:    Deep tendon reflexes in the upper and lower extremities are normal bilaterally.   Toes:    The toes are downgoing bilaterally.   Clonus:    Clonus is absent.    Assessment/Plan: This is a 30 year old female with acute onset right facial nerve palsy, she is improving, this occurred in December at this point there is no evidence steroids or antivirals will help, we will order an MRI of the brain because this is the second time this is occurred to her and we need to make sure there is no other etiology such as infiltrating tumor or compressive mass or demyelinating lesion, focal stroke.  We will also check some blood work and also sent to physical therapy. Also, discussed eye care. F/u if symptoms do not continue to  improve.   Orders Placed This Encounter  Procedures   MR BRAIN W WO CONTRAST   CBC   Comprehensive metabolic panel   Sjogren's syndrome antibods(ssa + ssb)   HIV Antibody (routine testing w rflx)   Angiotensin converting enzyme   Ambulatory referral to Occupational Therapy   No orders of the defined types were placed in this encounter.   Cc: Mack, Side  M, PA-C  Naomie Dean, MD  [facility] [address] [place], [place] [address]  Phone [phone] Fax [phone]

## 2020-03-13 ENCOUNTER — Ambulatory Visit: Payer: BC Managed Care – PPO | Admitting: Neurology

## 2020-03-13 ENCOUNTER — Other Ambulatory Visit: Payer: Self-pay

## 2020-03-13 ENCOUNTER — Encounter: Payer: Self-pay | Admitting: Neurology

## 2020-03-13 ENCOUNTER — Telehealth: Payer: Self-pay | Admitting: Neurology

## 2020-03-13 VITALS — BP 105/69 | HR 78 | Ht 62.0 in | Wt 175.0 lb

## 2020-03-13 DIAGNOSIS — G510 Bell's palsy: Secondary | ICD-10-CM

## 2020-03-13 DIAGNOSIS — R29810 Facial weakness: Secondary | ICD-10-CM

## 2020-03-13 NOTE — Telephone Encounter (Signed)
[employer] Berkley Harvey: [id number] (exp. 03/13/20 to 09/08/20) order sent to GI. They will reach out to the patient to schedule.

## 2020-03-13 NOTE — Patient Instructions (Signed)
MRI of the brain - we will call you Physical Therapy - Will call you Blood work today Follow up in 2-3 months if your symptoms do not significantly improve   Bell's Palsy, Adult  Bell's palsy is a short-term inability to move muscles in a part of the face. The inability to move, also called paralysis, results from inflammation or compression of the seventh cranial nerve. This nerve travels along the skull and under the ear to the side of the face. This nerve is responsible for facial movements that include blinking, closing the eyes, smiling, and frowning. What are the causes? The exact cause of this condition is not known. It may be caused by an infection from a virus, such as the chickenpox (herpes zoster), Epstein-Barr, or mumps virus. What increases the risk? You are more likely to develop this condition if:  You are pregnant.  You have diabetes.  You have had a recent infection in your nose, throat, or airways.  You have a weakened body defense system (immune system).  You have had a facial injury, such as a fracture.  You have a family history of Bell's palsy. What are the signs or symptoms? Symptoms of this condition include:  Weakness on one side of the face.  Drooping eyelid and corner of the mouth.  Excessive tearing in one eye.  Difficulty closing the eyelid.  Dry eye.  Drooling.  Dry mouth.  Changes in taste.  Change in facial appearance.  Pain behind one ear.  Ringing in one or both ears.  Sensitivity to sound in one ear.  Facial twitching.  Headache.  Impaired speech.  Dizziness.  Difficulty eating or drinking. Most of the time, only one side of the face is affected. In rare cases, Bell's palsy may affect the whole face. How is this diagnosed? This condition is diagnosed based on:  Your symptoms.  Your medical history.  A physical exam. You may also have to see health care providers who specialize in disorders of the nerves  (neurologist) or diseases and conditions of the eye (ophthalmologist). You may have tests, such as:  A test to check for nerve damage (electromyogram).  Imaging studies, such as a CT scan or an MRI.  Blood tests. How is this treated? This condition affects every person differently. Sometimes symptoms go away without treatment within a couple weeks. If treatment is needed, it varies from person to person. The goal of treatment is to reduce inflammation and protect the eye from damage. Treatment for Bell's palsy may include:  Medicines, such as: ? Steroids to reduce swelling and inflammation. ? Antiviral medicines. ? Pain relievers, including aspirin, acetaminophen, or ibuprofen.  Eye drops or ointment to keep your eye moist.  Eye protection, if you cannot close your eye.  Exercises or massage to regain muscle strength and function (physical therapy). Follow these instructions at home:  Take over-the-counter and prescription medicines only as told by your health care provider.  If your eye is affected: ? Keep your eye moist with eye drops or ointment as told by your health care provider. ? Follow instructions for eye care and protection as told by your health care provider.  Do any physical therapy exercises as told by your health care provider.  Keep all follow-up visits. This is important.   Contact a health care provider if:  You have a fever or chills.  Your symptoms do not get better within 2-3 weeks, or your symptoms get worse.  Your eye is red,  irritated, or painful.  You have new symptoms. Get help right away if:  You have weakness or numbness in a part of your body other than your face.  You have trouble swallowing.  You develop neck pain or stiffness.  You develop dizziness or shortness of breath. Summary  Bell's palsy is a short-term inability to move muscles in a part of the face. The inability to move results from inflammation or compression of the facial  nerve.  This condition affects every person differently. Sometimes symptoms go away without treatment within a couple weeks.  If treatment is needed, it varies from person to person. The goal of treatment is to reduce inflammation and protect the eye from damage.  Contact your health care provider if your symptoms do not get better within 2-3 weeks, or your symptoms get worse. This information is not intended to replace advice given to you by your health care provider. Make sure you discuss any questions you have with your health care provider. Document Revised: 10/13/2019 Document Reviewed: 10/13/2019 Elsevier Patient Education  2021 [employer].

## 2020-03-14 LAB — COMPREHENSIVE METABOLIC PANEL
ALT: 22 IU/L (ref 0–32)
AST: 18 IU/L (ref 0–40)
Albumin/Globulin Ratio: 1.7 (ref 1.2–2.2)
Albumin: 4.3 g/dL (ref 3.9–5.0)
Alkaline Phosphatase: 47 IU/L (ref 44–121)
BUN/Creatinine Ratio: 16 (ref 9–23)
BUN: 14 mg/dL (ref 6–20)
Bilirubin Total: 0.3 mg/dL (ref 0.0–1.2)
CO2: 22 mmol/L (ref 20–29)
Calcium: 9.1 mg/dL (ref 8.7–10.2)
Chloride: 104 mmol/L (ref 96–106)
Creatinine, Ser: 0.85 mg/dL (ref 0.57–1.00)
GFR calc Af Amer: 106 mL/min/{1.73_m2} (ref >59)
GFR calc non Af Amer: 92 mL/min/{1.73_m2} (ref >59)
Globulin, Total: 2.6 g/dL (ref 1.5–4.5)
Glucose: 89 mg/dL (ref 65–99)
Potassium: 4.4 mmol/L (ref 3.5–5.2)
Sodium: 139 mmol/L (ref 134–144)
Total Protein: 6.9 g/dL (ref 6.0–8.5)

## 2020-03-14 LAB — CBC
Hematocrit: 42.2 % (ref 34.0–46.6)
Hemoglobin: 13.5 g/dL (ref 11.1–15.9)
MCH: 26.2 pg — ABNORMAL LOW (ref 26.6–33.0)
MCHC: 32 g/dL (ref 31.5–35.7)
MCV: 82 fL (ref 79–97)
Platelets: 253 10*3/uL (ref 150–450)
RBC: 5.16 x10E6/uL (ref 3.77–5.28)
RDW: 13.1 % (ref 11.7–15.4)
WBC: 4.7 10*3/uL (ref 3.4–10.8)

## 2020-03-14 LAB — ANGIOTENSIN CONVERTING ENZYME: Angio Convert Enzyme: 44 U/L (ref 14–82)

## 2020-03-14 LAB — HIV ANTIBODY (ROUTINE TESTING W REFLEX): HIV Screen 4th Generation wRfx: NONREACTIVE

## 2020-03-14 LAB — SJOGREN'S SYNDROME ANTIBODS(SSA + SSB)
ENA SSA (RO) Ab: <0.2 AI (ref 0.0–0.9)
ENA SSB (LA) Ab: <0.2 AI (ref 0.0–0.9)

## 2021-10-02 LAB — OB RESULTS CONSOLE RPR: RPR: NONREACTIVE

## 2021-10-02 LAB — OB RESULTS CONSOLE GC/CHLAMYDIA
Chlamydia: NEGATIVE
Neisseria Gonorrhea: NEGATIVE

## 2021-10-02 LAB — OB RESULTS CONSOLE ABO/RH: RH Type: POSITIVE

## 2021-10-02 LAB — OB RESULTS CONSOLE HEPATITIS B SURFACE ANTIGEN: Hepatitis B Surface Ag: NEGATIVE

## 2021-10-02 LAB — OB RESULTS CONSOLE RUBELLA ANTIBODY, IGM: Rubella: IMMUNE

## 2021-10-02 LAB — OB RESULTS CONSOLE HIV ANTIBODY (ROUTINE TESTING): HIV: NONREACTIVE

## 2021-10-02 LAB — OB RESULTS CONSOLE ANTIBODY SCREEN: Antibody Screen: NEGATIVE

## 2021-12-09 ENCOUNTER — Other Ambulatory Visit: Payer: Self-pay | Admitting: Obstetrics and Gynecology

## 2021-12-09 DIAGNOSIS — Z363 Encounter for antenatal screening for malformations: Secondary | ICD-10-CM

## 2021-12-31 ENCOUNTER — Encounter: Payer: Self-pay | Admitting: *Deleted

## 2022-01-06 ENCOUNTER — Ambulatory Visit: Payer: Managed Care, Other (non HMO) | Admitting: *Deleted

## 2022-01-06 ENCOUNTER — Ambulatory Visit: Payer: Managed Care, Other (non HMO) | Attending: Obstetrics and Gynecology

## 2022-01-06 ENCOUNTER — Encounter: Payer: Self-pay | Admitting: *Deleted

## 2022-01-06 VITALS — BP 116/68 | HR 106

## 2022-01-06 DIAGNOSIS — D563 Thalassemia minor: Secondary | ICD-10-CM

## 2022-01-06 DIAGNOSIS — O285 Abnormal chromosomal and genetic finding on antenatal screening of mother: Secondary | ICD-10-CM | POA: Diagnosis not present

## 2022-01-06 DIAGNOSIS — Z363 Encounter for antenatal screening for malformations: Secondary | ICD-10-CM | POA: Insufficient documentation

## 2022-01-06 DIAGNOSIS — Q02 Microcephaly: Secondary | ICD-10-CM

## 2022-01-06 DIAGNOSIS — O99212 Obesity complicating pregnancy, second trimester: Secondary | ICD-10-CM

## 2022-01-06 DIAGNOSIS — O09292 Supervision of pregnancy with other poor reproductive or obstetric history, second trimester: Secondary | ICD-10-CM

## 2022-01-06 DIAGNOSIS — Z3A22 22 weeks gestation of pregnancy: Secondary | ICD-10-CM

## 2022-01-06 DIAGNOSIS — O3507X0 Maternal care for (suspected) central nervous system malformation or damage in fetus, microcephaly, not applicable or unspecified: Secondary | ICD-10-CM

## 2022-01-06 DIAGNOSIS — E669 Obesity, unspecified: Secondary | ICD-10-CM

## 2022-01-07 ENCOUNTER — Other Ambulatory Visit: Payer: Self-pay | Admitting: *Deleted

## 2022-01-07 DIAGNOSIS — Z3689 Encounter for other specified antenatal screening: Secondary | ICD-10-CM

## 2022-01-27 NOTE — L&D Delivery Note (Signed)
Delivery Note At 3:00 PM a viable and healthy female was delivered via  (Presentation: left occiput, anterior     ).  APGAR: 8, 9; weight pending.   Placenta status: Spontaneous, Intact.  Cord: 3V    Patient pushed for approximately 10 minutes and delivered a vigorous female infant in occiput anterior presentation with Apgars of 8 at 1 minute and 9 at 5 minutes.  Following delivery, infant was passed to the waiting maternal abdomen and bulb suction.  Following a one minute delay, the cord was clamped and cut.  The placenta delivered spontaneously, intact, with three-vessel cord.  A first-degree vaginal laceration repair with 3-0 Vicryl.  All sponge, instrument, needle counts were correct.  Mom and baby are doing well following delivery.  EBL 234 cc.  Anesthesia: Epidural Episiotomy: None Lacerations: 1st degree Suture Repair: 3.0 vicryl Est. Blood Loss (mL):  234 cc  Mom to postpartum.  Baby to Couplet care / Skin to Skin.  Vanessa Kick 04/16/2022, 3:18 PM

## 2022-03-03 ENCOUNTER — Inpatient Hospital Stay (HOSPITAL_COMMUNITY)
Admission: AD | Admit: 2022-03-03 | Discharge: 2022-03-03 | Disposition: A | Discharge status: Home / Self Care | Payer: Managed Care, Other (non HMO) | Attending: Obstetrics | Admitting: Obstetrics

## 2022-03-03 ENCOUNTER — Ambulatory Visit: Payer: Managed Care, Other (non HMO) | Admitting: *Deleted

## 2022-03-03 ENCOUNTER — Encounter (HOSPITAL_COMMUNITY): Payer: Self-pay | Admitting: Obstetrics

## 2022-03-03 ENCOUNTER — Ambulatory Visit: Payer: Managed Care, Other (non HMO)

## 2022-03-03 VITALS — BP 143/78 | HR 104

## 2022-03-03 DIAGNOSIS — O26893 Other specified pregnancy related conditions, third trimester: Secondary | ICD-10-CM | POA: Diagnosis not present

## 2022-03-03 DIAGNOSIS — R030 Elevated blood-pressure reading, without diagnosis of hypertension: Secondary | ICD-10-CM

## 2022-03-03 DIAGNOSIS — O99891 Other specified diseases and conditions complicating pregnancy: Secondary | ICD-10-CM | POA: Diagnosis not present

## 2022-03-03 DIAGNOSIS — Z3689 Encounter for other specified antenatal screening: Secondary | ICD-10-CM

## 2022-03-03 DIAGNOSIS — O99213 Obesity complicating pregnancy, third trimester: Secondary | ICD-10-CM

## 2022-03-03 DIAGNOSIS — O09293 Supervision of pregnancy with other poor reproductive or obstetric history, third trimester: Secondary | ICD-10-CM | POA: Diagnosis not present

## 2022-03-03 DIAGNOSIS — D563 Thalassemia minor: Secondary | ICD-10-CM

## 2022-03-03 DIAGNOSIS — Z3A30 30 weeks gestation of pregnancy: Secondary | ICD-10-CM

## 2022-03-03 DIAGNOSIS — E669 Obesity, unspecified: Secondary | ICD-10-CM

## 2022-03-03 LAB — COMPREHENSIVE METABOLIC PANEL
ALT: 16 U/L (ref 0–44)
AST: 16 U/L (ref 15–41)
Albumin: 3 g/dL — ABNORMAL LOW (ref 3.5–5.0)
Alkaline Phosphatase: 51 U/L (ref 38–126)
Anion gap: 10 (ref 5–15)
BUN: 6 mg/dL (ref 6–20)
CO2: 21 mmol/L — ABNORMAL LOW (ref 22–32)
Calcium: 8.8 mg/dL — ABNORMAL LOW (ref 8.9–10.3)
Chloride: 102 mmol/L (ref 98–111)
Creatinine, Ser: 0.63 mg/dL (ref 0.44–1.00)
GFR, Estimated: >60 mL/min (ref >60)
Glucose, Bld: 113 mg/dL — ABNORMAL HIGH (ref 70–99)
Potassium: 3.6 mmol/L (ref 3.5–5.1)
Sodium: 133 mmol/L — ABNORMAL LOW (ref 135–145)
Total Bilirubin: <0.1 mg/dL — ABNORMAL LOW (ref 0.3–1.2)
Total Protein: 6.5 g/dL (ref 6.5–8.1)

## 2022-03-03 LAB — CBC
HCT: 34.7 % — ABNORMAL LOW (ref 36.0–46.0)
Hemoglobin: 11.5 g/dL — ABNORMAL LOW (ref 12.0–15.0)
MCH: 26.7 pg (ref 26.0–34.0)
MCHC: 33.1 g/dL (ref 30.0–36.0)
MCV: 80.7 fL (ref 80.0–100.0)
Platelets: 170 10*3/uL (ref 150–400)
RBC: 4.3 MIL/uL (ref 3.87–5.11)
RDW: 13.5 % (ref 11.5–15.5)
WBC: 9.2 10*3/uL (ref 4.0–10.5)
nRBC: 0 % (ref 0.0–0.2)

## 2022-03-03 LAB — PROTEIN / CREATININE RATIO, URINE
Creatinine, Urine: 124 mg/dL
Protein Creatinine Ratio: 0.28 mg/mg{Cre} — ABNORMAL HIGH (ref 0.00–0.15)
Total Protein, Urine: 35 mg/dL

## 2022-03-03 NOTE — MAU Note (Signed)
Laura [name] is a 32 y.o. at [redacted]w[redacted]d here in MAU reporting: was at MFM, BP was high.  Denies HA, visual changes, epigastric pain or increase in swelling. Denies vaginal bleeding, LOF or decrease in FM.   Onset of complaint: today Pain score: none Vitals:   03/03/22 1149  BP: 131/66  Pulse: 100  Resp: 18  Temp: 98.5 F (36.9 C)  SpO2: 99%     FHT:144 Lab orders placed from triage:  urine collected

## 2022-03-03 NOTE — MAU Provider Note (Signed)
History     CSN: [id number]  Arrival date and time: 03/03/22 1138   Chief Complaint  Patient presents with   Hypertension   HPI This is a 32 year old G2 P0-0-1-0 at 30 weeks and 2 days who was evaluated at the MFM office earlier today for routine growth ultrasound.  She had a couple of elevated blood pressures there and was sent to the MAU for evaluation.  She otherwise has no complaints: Denies headache, vision changes, right upper quadrant abdominal pain, swelling.  OB History     Gravida  2   Para  0   Term  0   Preterm  0   AB  1   Living         SAB  1   IAB  0   Ectopic  0   Multiple      Live Births              Past Medical History:  Diagnosis Date   Medical history non-contributory     Past Surgical History:  Procedure Laterality Date   BREAST SURGERY     breast bx left breast    Family History  Problem Relation Age of Onset   Cancer Mother        pancreatic   Diabetes Mother    Diabetes Father    Diabetes Maternal Grandmother    Hyperlipidemia Maternal Grandmother    Diabetes Maternal Grandfather    Hyperlipidemia Maternal Grandfather    Hyperlipidemia Paternal Grandmother    Hyperlipidemia Paternal Grandfather     Social History   Tobacco Use   Smoking status: Never   Smokeless tobacco: Never  Vaping Use   Vaping Use: Never used  Substance Use Topics   Alcohol use: Never   Drug use: Never    Allergies: No Known Allergies  Medications Prior to Admission  Medication Sig Dispense Refill Last Dose   doxylamine, Sleep, (UNISOM) 25 MG tablet Take 25 mg by mouth at bedtime as needed.   0/03/5007   folic acid (FOLVITE) 1 MG tablet Take 1 mg by mouth daily.   03/02/2022   pyridOXINE (VITAMIN B6) 25 MG tablet Take 25 mg by mouth daily.   03/02/2022    Review of Systems Physical Exam   Blood pressure 127/66, pulse (!) 123, temperature 98.5 F (36.9 C), temperature source Oral, resp. rate 18, height 5\' 2"  (1.575 m), weight 88.6  kg, last menstrual period 07/27/2021, SpO2 99 %.  Patient Vitals for the past 24 hrs:  BP Temp Temp src Pulse Resp SpO2 Height Weight  03/03/22 1231 114/67 -- -- (!) 116 -- 98 % -- --  03/03/22 1226 -- -- -- -- -- 99 % -- --  03/03/22 1221 -- -- -- -- -- 99 % -- --  03/03/22 1216 125/65 -- -- (!) 114 -- 99 % -- --  03/03/22 1211 -- -- -- -- -- 99 % -- --  03/03/22 1210 127/66 -- -- (!) 123 -- -- -- --  03/03/22 1149 131/66 98.5 F (36.9 C) Oral 100 18 99 % 5\' 2"  (1.575 m) 88.6 kg     Physical Exam Vitals reviewed.  Constitutional:      Appearance: Normal appearance.  HENT:     Head: Normocephalic and atraumatic.  Cardiovascular:     Rate and Rhythm: Normal rate.  Pulmonary:     Effort: Pulmonary effort is normal.  Abdominal:     General: Abdomen is flat. There is no distension.  Palpations: Abdomen is soft.     Tenderness: There is no abdominal tenderness.  Neurological:     General: No focal deficit present.     Mental Status: She is alert.  Psychiatric:        Mood and Affect: Mood normal.        Behavior: Behavior normal.        Thought Content: Thought content normal.        Judgment: Judgment normal.    Results for orders placed or performed during the hospital encounter of 03/03/22 (from the past 24 hour(s))  CBC     Status: Abnormal   Collection Time: 03/03/22 11:59 AM  Result Value Ref Range   WBC 9.2 4.0 - 10.5 K/uL   RBC 4.30 3.87 - 5.11 MIL/uL   Hemoglobin 11.5 (L) 12.0 - 15.0 g/dL   HCT 34.7 (L) 36.0 - 46.0 %   MCV 80.7 80.0 - 100.0 fL   MCH 26.7 26.0 - 34.0 pg   MCHC 33.1 30.0 - 36.0 g/dL   RDW 13.5 11.5 - 15.5 %   Platelets 170 150 - 400 K/uL   nRBC 0.0 0.0 - 0.2 %  Comprehensive metabolic panel     Status: Abnormal   Collection Time: 03/03/22 11:59 AM  Result Value Ref Range   Sodium 133 (L) 135 - 145 mmol/L   Potassium 3.6 3.5 - 5.1 mmol/L   Chloride 102 98 - 111 mmol/L   CO2 21 (L) 22 - 32 mmol/L   Glucose, Bld 113 (H) 70 - 99 mg/dL    BUN 6 6 - 20 mg/dL   Creatinine, Ser 0.63 0.44 - 1.00 mg/dL   Calcium 8.8 (L) 8.9 - 10.3 mg/dL   Total Protein 6.5 6.5 - 8.1 g/dL   Albumin 3.0 (L) 3.5 - 5.0 g/dL   AST 16 15 - 41 U/L   ALT 16 0 - 44 U/L   Alkaline Phosphatase 51 38 - 126 U/L   Total Bilirubin <0.1 (L) 0.3 - 1.2 mg/dL   GFR, Estimated >60 >60 mL/min   Anion gap 10 5 - 15  Protein / creatinine ratio, urine     Status: Abnormal   Collection Time: 03/03/22 12:01 PM  Result Value Ref Range   Creatinine, Urine 124 mg/dL   Total Protein, Urine 35 mg/dL   Protein Creatinine Ratio 0.28 (H) 0.00 - 0.15 mg/mg[Cre]     MAU Course  Procedures NST:  Baseline: 145  Variability: moderate Accelerations:  present Decelerations: none Contractions: none  MDM  Assessment and Plan   1. [redacted] weeks gestation of pregnancy   2. Elevated BP without diagnosis of hypertension    BPs here normal. Labs reasuring. D/c to home. F/u in office  Truett Mainland 03/03/2022, 12:18 PM

## 2022-03-04 ENCOUNTER — Encounter (HOSPITAL_COMMUNITY): Payer: Self-pay | Admitting: Obstetrics and Gynecology

## 2022-03-04 ENCOUNTER — Other Ambulatory Visit: Payer: Self-pay | Admitting: *Deleted

## 2022-03-04 ENCOUNTER — Other Ambulatory Visit: Payer: Self-pay

## 2022-03-04 ENCOUNTER — Inpatient Hospital Stay (HOSPITAL_COMMUNITY)
Admission: AD | Admit: 2022-03-04 | Discharge: 2022-03-04 | Disposition: A | Discharge status: Home / Self Care | Payer: Managed Care, Other (non HMO) | Attending: Obstetrics and Gynecology | Admitting: Obstetrics and Gynecology

## 2022-03-04 DIAGNOSIS — O133 Gestational [pregnancy-induced] hypertension without significant proteinuria, third trimester: Secondary | ICD-10-CM

## 2022-03-04 DIAGNOSIS — O99353 Diseases of the nervous system complicating pregnancy, third trimester: Secondary | ICD-10-CM | POA: Insufficient documentation

## 2022-03-04 DIAGNOSIS — R030 Elevated blood-pressure reading, without diagnosis of hypertension: Secondary | ICD-10-CM | POA: Diagnosis not present

## 2022-03-04 DIAGNOSIS — G44209 Tension-type headache, unspecified, not intractable: Secondary | ICD-10-CM | POA: Diagnosis not present

## 2022-03-04 DIAGNOSIS — O26893 Other specified pregnancy related conditions, third trimester: Secondary | ICD-10-CM | POA: Insufficient documentation

## 2022-03-04 DIAGNOSIS — Z3A30 30 weeks gestation of pregnancy: Secondary | ICD-10-CM

## 2022-03-04 MED ORDER — CYCLOBENZAPRINE HCL 5 MG PO TABS
10.0000 mg | ORAL_TABLET | Freq: Once | ORAL | Status: AC
  Administered 2022-03-04: 10 mg via ORAL
  Filled 2022-03-04: qty 2

## 2022-03-04 MED ORDER — EXCEDRIN MIGRAINE 250-250-65 MG PO TABS: 1.0000 | ORAL_TABLET | Freq: Four times a day (QID) | ORAL | 0 refills | Status: DC | PRN

## 2022-03-04 MED ORDER — ACETAMINOPHEN-CAFFEINE 500-65 MG PO TABS
2.0000 | ORAL_TABLET | Freq: Once | ORAL | Status: AC
  Administered 2022-03-04: 2 via ORAL
  Filled 2022-03-04: qty 2

## 2022-03-04 MED ORDER — CYCLOBENZAPRINE HCL 10 MG PO TABS: 10.0000 mg | ORAL_TABLET | Freq: Two times a day (BID) | ORAL | 0 refills | Status: DC | PRN

## 2022-03-04 NOTE — MAU Note (Signed)
Laura [name] is a 32 y.o. at [redacted]w[redacted]d here in MAU reporting: she's been taking her BP @ home throughout the day and had 3 elevated BP's.   Endorses current HA and intermittent epigastric pain.  Denies visual disturbances. Endorses +FM, denies VB or LOF. LMP: NA Onset of complaint: today Pain score: 5 Vitals:   03/04/22 1814  BP: 135/78  Pulse: (!) 102  Resp: 18  Temp: 98.5 F (36.9 C)  SpO2: 99%     FHT:145 bpm Lab orders placed from triage:   UA

## 2022-03-04 NOTE — MAU Provider Note (Signed)
History     CSN: [id number]  Arrival date and time: 03/04/22 1743   Event Date/Time   First Provider Initiated Contact with Patient 03/04/22 1939      Chief Complaint  Patient presents with   BP Evaluation   HPI Patient is 32 y.o. G2P0010 [redacted]w[redacted]d here with complaints of headache with elevated blood pressure on home monitor 130-150 SBP. Has three elevated BP at home. Reports she has not tried anything for her HA at home.  Rates HA as 4/10. Eating and drinking normally. Pregnancy is uncomplicated except for history of 18 wk PPROM in prior pregnancy  +FM, denies LOF, VB, contractions, vaginal discharge.  OB History     Gravida  2   Para  0   Term  0   Preterm  0   AB  1   Living         SAB  1   IAB  0   Ectopic  0   Multiple      Live Births              Past Medical History:  Diagnosis Date   Medical history non-contributory     Past Surgical History:  Procedure Laterality Date   BREAST SURGERY     breast bx left breast    Family History  Problem Relation Age of Onset   Cancer Mother        pancreatic   Diabetes Mother    Diabetes Father    Diabetes Maternal Grandmother    Hyperlipidemia Maternal Grandmother    Diabetes Maternal Grandfather    Hyperlipidemia Maternal Grandfather    Hyperlipidemia Paternal Grandmother    Hyperlipidemia Paternal Grandfather     Social History   Tobacco Use   Smoking status: Never   Smokeless tobacco: Never  Vaping Use   Vaping Use: Never used  Substance Use Topics   Alcohol use: Never   Drug use: Never    Allergies: No Known Allergies  Medications Prior to Admission  Medication Sig Dispense Refill Last Dose   doxylamine, Sleep, (UNISOM) 25 MG tablet Take 25 mg by mouth at bedtime as needed.   10/04/3530   folic acid (FOLVITE) 1 MG tablet Take 1 mg by mouth daily.   03/03/2022   pyridOXINE (VITAMIN B6) 25 MG tablet Take 25 mg by mouth daily.   03/03/2022    Review of Systems  Constitutional:   Negative for chills and fever.  HENT:  Negative for congestion and sore throat.   Eyes:  Negative for pain and visual disturbance.  Respiratory:  Negative for cough, chest tightness and shortness of breath.   Cardiovascular:  Negative for chest pain.  Gastrointestinal:  Negative for abdominal pain, diarrhea, nausea and vomiting.  Endocrine: Negative for cold intolerance and heat intolerance.  Genitourinary:  Negative for dysuria and flank pain.  Musculoskeletal:  Negative for back pain.  Skin:  Negative for rash.  Allergic/Immunologic: Negative for food allergies.  Neurological:  Negative for dizziness and light-headedness.  Psychiatric/Behavioral:  Negative for agitation.    Physical Exam   Blood pressure 126/75, pulse (!) 101, temperature 98.5 F (36.9 C), temperature source Oral, resp. rate 18, last menstrual period 07/27/2021, SpO2 99 %.  Patient Vitals for the past 24 hrs:  BP Temp Temp src Pulse Resp SpO2  03/04/22 2100 126/75 -- -- (!) 101 -- 99 %  03/04/22 2045 131/78 -- -- 89 -- 99 %  03/04/22 2030 124/73 -- -- 90 --  100 %  03/04/22 2015 120/75 -- -- 97 -- 100 %  03/04/22 2000 123/79 -- -- 98 -- 100 %  03/04/22 1945 115/74 -- -- (!) 105 -- 100 %  03/04/22 1930 128/66 -- -- 91 -- 100 %  03/04/22 1915 124/72 -- -- 97 -- 100 %  03/04/22 1900 123/67 -- -- (!) 101 -- 100 %  03/04/22 1845 131/66 -- -- 98 -- 99 %  03/04/22 1830 125/69 -- -- 100 -- 97 %  03/04/22 1814 135/78 98.5 F (36.9 C) Oral (!) 102 18 99 %    Physical Exam Vitals and nursing note reviewed.  Constitutional:      General: She is not in acute distress.    Appearance: She is well-developed.     Comments: Pregnant female  HENT:     Head: Normocephalic and atraumatic.  Eyes:     General: No scleral icterus.    Conjunctiva/sclera: Conjunctivae normal.  Cardiovascular:     Rate and Rhythm: Normal rate.  Pulmonary:     Effort: Pulmonary effort is normal.  Chest:     Chest wall: No tenderness.   Abdominal:     Palpations: Abdomen is soft.     Tenderness: There is no abdominal tenderness. There is no guarding or rebound.     Comments: Gravid  Genitourinary:    Vagina: Normal.  Musculoskeletal:        General: Normal range of motion.     Cervical back: Normal range of motion and neck supple.  Skin:    General: Skin is warm and dry.     Findings: No rash.  Neurological:     Mental Status: She is alert and oriented to person, place, and time.     MAU Course  Procedures  MDM Low  NST 140/Moderate/+accels, no decels Toco quiet  BP at MAU have been WNL HA improved from 4 to 3/ 10 with Excedrin Migraine Ordered flexeril RN assessed and patient did not eat dinner. Offered food  10:05 PM RN assessment and patient reports resolved HA  Assessment and Plan   1. Acute non intractable tension-type headache   - No elevated BPs here at the MAU - treated with meds here - Rx for excedrin and flexeril on discharge - follow up with primary OB at scheduled visit, recommended bringing her BP cuff to have it calibrated to office.   Juanita Craver [facility] 03/04/2022, 10:05 PM

## 2022-03-10 ENCOUNTER — Ambulatory Visit: Payer: Managed Care, Other (non HMO) | Attending: Maternal & Fetal Medicine | Admitting: *Deleted

## 2022-03-10 ENCOUNTER — Ambulatory Visit: Payer: Managed Care, Other (non HMO) | Admitting: *Deleted

## 2022-03-10 VITALS — BP 125/72 | HR 115

## 2022-03-10 DIAGNOSIS — Z3A31 31 weeks gestation of pregnancy: Secondary | ICD-10-CM | POA: Diagnosis not present

## 2022-03-10 DIAGNOSIS — Z8759 Personal history of other complications of pregnancy, childbirth and the puerperium: Secondary | ICD-10-CM

## 2022-03-10 DIAGNOSIS — O133 Gestational [pregnancy-induced] hypertension without significant proteinuria, third trimester: Secondary | ICD-10-CM | POA: Diagnosis present

## 2022-03-10 DIAGNOSIS — O10913 Unspecified pre-existing hypertension complicating pregnancy, third trimester: Secondary | ICD-10-CM

## 2022-03-10 NOTE — Procedures (Addendum)
Laura [name] [birth year] [redacted]w[redacted]d Fetus A Non-Stress Test Interpretation for 03/10/22  Indication:  GHTN, hx IUFD  Fetal Heart Rate A Mode: External Baseline Rate (A): 145 bpm Variability: Moderate Accelerations: 15 x 15 Decelerations: None Multiple birth?: No  Uterine Activity Mode: Palpation, Toco Contraction Frequency (min): None  Interpretation (Fetal Testing) Nonstress Test Interpretation: Reactive Comments: Dr. SEpimenio Sarinreviewed tracing.

## 2022-03-17 ENCOUNTER — Ambulatory Visit: Payer: Managed Care, Other (non HMO) | Admitting: *Deleted

## 2022-03-17 ENCOUNTER — Ambulatory Visit: Payer: Managed Care, Other (non HMO) | Attending: Maternal & Fetal Medicine | Admitting: *Deleted

## 2022-03-17 VITALS — BP 141/73 | HR 119

## 2022-03-17 VITALS — BP 139/69 | HR 120

## 2022-03-17 DIAGNOSIS — O99213 Obesity complicating pregnancy, third trimester: Secondary | ICD-10-CM | POA: Diagnosis not present

## 2022-03-17 DIAGNOSIS — R030 Elevated blood-pressure reading, without diagnosis of hypertension: Secondary | ICD-10-CM | POA: Insufficient documentation

## 2022-03-17 DIAGNOSIS — O26893 Other specified pregnancy related conditions, third trimester: Secondary | ICD-10-CM | POA: Insufficient documentation

## 2022-03-17 DIAGNOSIS — Z3A32 32 weeks gestation of pregnancy: Secondary | ICD-10-CM | POA: Insufficient documentation

## 2022-03-17 NOTE — Procedures (Signed)
Laura [name] 02-03-1990 [redacted]w[redacted]d Fetus A Non-Stress Test Interpretation for 03/17/22  Indication:  Elevated BP, Obesity  Fetal Heart Rate A Mode: External Baseline Rate (A): 150 bpm Variability: Moderate Accelerations: 15 x 15 Decelerations: None Multiple birth?: No  Uterine Activity Mode: Palpation, Toco Contraction Frequency (min): None  Interpretation (Fetal Testing) Nonstress Test Interpretation: Reactive Comments: Dr. SDonalee Citrinreviewed tracing.

## 2022-03-24 ENCOUNTER — Ambulatory Visit: Payer: Managed Care, Other (non HMO) | Admitting: *Deleted

## 2022-03-24 ENCOUNTER — Ambulatory Visit: Payer: Managed Care, Other (non HMO) | Attending: Maternal & Fetal Medicine | Admitting: *Deleted

## 2022-03-24 VITALS — BP 137/74 | HR 104

## 2022-03-24 DIAGNOSIS — O169 Unspecified maternal hypertension, unspecified trimester: Secondary | ICD-10-CM

## 2022-03-24 DIAGNOSIS — Z3A33 33 weeks gestation of pregnancy: Secondary | ICD-10-CM | POA: Diagnosis not present

## 2022-03-24 DIAGNOSIS — O133 Gestational [pregnancy-induced] hypertension without significant proteinuria, third trimester: Secondary | ICD-10-CM

## 2022-03-24 NOTE — Procedures (Cosign Needed Addendum)
Laura [name] 07-Aug-1990 [redacted]w[redacted]d Fetus A Non-Stress Test Interpretation for 03/24/22  Indication:  GHTN  Fetal Heart Rate A Mode: External Baseline Rate (A): 145 bpm Variability: Moderate Accelerations: 15 x 15 Decelerations: None Multiple birth?: No  Uterine Activity Mode: Palpation, Toco Contraction Frequency (min): Occas UI Contraction Quality: Mild  Interpretation (Fetal Testing) Nonstress Test Interpretation: Reactive Comments: Dr. SEpimenio Sarinreviewed tracing.

## 2022-03-28 ENCOUNTER — Inpatient Hospital Stay (HOSPITAL_COMMUNITY)
Admission: RE | Admit: 2022-03-28 | Payer: Managed Care, Other (non HMO) | Source: Home / Self Care | Admitting: Obstetrics and Gynecology

## 2022-03-31 ENCOUNTER — Ambulatory Visit: Payer: Managed Care, Other (non HMO) | Attending: Maternal & Fetal Medicine

## 2022-03-31 ENCOUNTER — Ambulatory Visit: Payer: Managed Care, Other (non HMO) | Admitting: *Deleted

## 2022-03-31 VITALS — BP 125/72 | HR 108

## 2022-03-31 DIAGNOSIS — O99213 Obesity complicating pregnancy, third trimester: Secondary | ICD-10-CM

## 2022-03-31 DIAGNOSIS — Z3A34 34 weeks gestation of pregnancy: Secondary | ICD-10-CM

## 2022-03-31 DIAGNOSIS — O133 Gestational [pregnancy-induced] hypertension without significant proteinuria, third trimester: Secondary | ICD-10-CM | POA: Diagnosis present

## 2022-03-31 DIAGNOSIS — O09293 Supervision of pregnancy with other poor reproductive or obstetric history, third trimester: Secondary | ICD-10-CM

## 2022-03-31 DIAGNOSIS — D563 Thalassemia minor: Secondary | ICD-10-CM | POA: Diagnosis not present

## 2022-03-31 DIAGNOSIS — E669 Obesity, unspecified: Secondary | ICD-10-CM

## 2022-04-01 LAB — OB RESULTS CONSOLE GBS: GBS: NEGATIVE

## 2022-04-07 ENCOUNTER — Ambulatory Visit: Payer: Managed Care, Other (non HMO) | Attending: Maternal & Fetal Medicine

## 2022-04-07 ENCOUNTER — Ambulatory Visit: Payer: Managed Care, Other (non HMO)

## 2022-04-09 ENCOUNTER — Telehealth (HOSPITAL_COMMUNITY): Payer: Self-pay | Admitting: *Deleted

## 2022-04-09 ENCOUNTER — Encounter (HOSPITAL_COMMUNITY): Payer: Self-pay

## 2022-04-09 NOTE — Telephone Encounter (Signed)
Preadmission screen  

## 2022-04-10 ENCOUNTER — Other Ambulatory Visit: Payer: Self-pay | Admitting: Obstetrics and Gynecology

## 2022-04-10 ENCOUNTER — Encounter (HOSPITAL_COMMUNITY): Payer: Self-pay | Admitting: *Deleted

## 2022-04-10 ENCOUNTER — Telehealth (HOSPITAL_COMMUNITY): Payer: Self-pay | Admitting: *Deleted

## 2022-04-10 DIAGNOSIS — O133 Gestational [pregnancy-induced] hypertension without significant proteinuria, third trimester: Secondary | ICD-10-CM

## 2022-04-10 NOTE — Telephone Encounter (Signed)
Preadmission screen  

## 2022-04-14 ENCOUNTER — Ambulatory Visit (HOSPITAL_BASED_OUTPATIENT_CLINIC_OR_DEPARTMENT_OTHER): Payer: Managed Care, Other (non HMO) | Admitting: *Deleted

## 2022-04-14 ENCOUNTER — Ambulatory Visit: Payer: Managed Care, Other (non HMO) | Admitting: *Deleted

## 2022-04-14 VITALS — BP 136/79

## 2022-04-14 VITALS — BP 149/73 | HR 99

## 2022-04-14 DIAGNOSIS — Z3A36 36 weeks gestation of pregnancy: Secondary | ICD-10-CM

## 2022-04-14 DIAGNOSIS — O133 Gestational [pregnancy-induced] hypertension without significant proteinuria, third trimester: Secondary | ICD-10-CM

## 2022-04-14 DIAGNOSIS — O99213 Obesity complicating pregnancy, third trimester: Secondary | ICD-10-CM

## 2022-04-14 NOTE — Procedures (Signed)
Laura [name] Mar 24, 1990 [redacted]w[redacted]d  Fetus A Non-Stress Test Interpretation for 04/14/22  Indication:  GHTN, Obesity  Fetal Heart Rate A Mode: External Baseline Rate (A): 145 bpm Variability: Moderate Accelerations: 15 x 15 Decelerations: None Multiple birth?: No  Uterine Activity Mode: Palpation, Toco Contraction Frequency (min): UI Contraction Quality: Mild  Interpretation (Fetal Testing) Nonstress Test Interpretation: Reactive Comments: Dr. Annamaria Boots reviewed tracing.

## 2022-04-16 ENCOUNTER — Inpatient Hospital Stay (HOSPITAL_COMMUNITY): Payer: Managed Care, Other (non HMO)

## 2022-04-16 ENCOUNTER — Inpatient Hospital Stay (HOSPITAL_COMMUNITY): Payer: Managed Care, Other (non HMO) | Admitting: Anesthesiology

## 2022-04-16 ENCOUNTER — Inpatient Hospital Stay (HOSPITAL_COMMUNITY)
Admission: RE | Admit: 2022-04-16 | Discharge: 2022-04-18 | DRG: 807 | Disposition: A | Discharge status: Home / Self Care | Payer: Managed Care, Other (non HMO) | Attending: Obstetrics and Gynecology | Admitting: Obstetrics and Gynecology

## 2022-04-16 ENCOUNTER — Encounter (HOSPITAL_COMMUNITY): Payer: Self-pay | Admitting: Obstetrics and Gynecology

## 2022-04-16 ENCOUNTER — Other Ambulatory Visit: Payer: Self-pay

## 2022-04-16 DIAGNOSIS — O134 Gestational [pregnancy-induced] hypertension without significant proteinuria, complicating childbirth: Secondary | ICD-10-CM | POA: Diagnosis present

## 2022-04-16 DIAGNOSIS — Z3A37 37 weeks gestation of pregnancy: Secondary | ICD-10-CM

## 2022-04-16 DIAGNOSIS — O133 Gestational [pregnancy-induced] hypertension without significant proteinuria, third trimester: Secondary | ICD-10-CM

## 2022-04-16 DIAGNOSIS — O139 Gestational [pregnancy-induced] hypertension without significant proteinuria, unspecified trimester: Principal | ICD-10-CM

## 2022-04-16 LAB — CBC
HCT: 32.3 % — ABNORMAL LOW (ref 36.0–46.0)
Hemoglobin: 10.6 g/dL — ABNORMAL LOW (ref 12.0–15.0)
MCH: 25.7 pg — ABNORMAL LOW (ref 26.0–34.0)
MCHC: 32.8 g/dL (ref 30.0–36.0)
MCV: 78.2 fL — ABNORMAL LOW (ref 80.0–100.0)
Platelets: 182 10*3/uL (ref 150–400)
RBC: 4.13 MIL/uL (ref 3.87–5.11)
RDW: 14.6 % (ref 11.5–15.5)
WBC: 9 10*3/uL (ref 4.0–10.5)
nRBC: 0 % (ref 0.0–0.2)

## 2022-04-16 LAB — COMPREHENSIVE METABOLIC PANEL
ALT: 15 U/L (ref 0–44)
AST: 18 U/L (ref 15–41)
Albumin: 2.7 g/dL — ABNORMAL LOW (ref 3.5–5.0)
Alkaline Phosphatase: 65 U/L (ref 38–126)
Anion gap: 8 (ref 5–15)
BUN: 8 mg/dL (ref 6–20)
CO2: 20 mmol/L — ABNORMAL LOW (ref 22–32)
Calcium: 9 mg/dL (ref 8.9–10.3)
Chloride: 107 mmol/L (ref 98–111)
Creatinine, Ser: 0.69 mg/dL (ref 0.44–1.00)
GFR, Estimated: >60 mL/min (ref >60)
Glucose, Bld: 96 mg/dL (ref 70–99)
Potassium: 3.9 mmol/L (ref 3.5–5.1)
Sodium: 135 mmol/L (ref 135–145)
Total Bilirubin: 0.4 mg/dL (ref 0.3–1.2)
Total Protein: 6.3 g/dL — ABNORMAL LOW (ref 6.5–8.1)

## 2022-04-16 LAB — HEPATITIS C ANTIBODY: HCV Ab: NONREACTIVE

## 2022-04-16 LAB — RPR: RPR Ser Ql: NONREACTIVE

## 2022-04-16 LAB — TYPE AND SCREEN
ABO/RH(D): A POS
Antibody Screen: NEGATIVE

## 2022-04-16 LAB — PLATELET COUNT: Platelets: 163 10*3/uL (ref 150–400)

## 2022-04-16 MED ORDER — OXYTOCIN BOLUS FROM INFUSION
333.0000 mL | Freq: Once | INTRAVENOUS | Status: AC
  Administered 2022-04-16: 333 mL via INTRAVENOUS

## 2022-04-16 MED ORDER — ONDANSETRON HCL 4 MG/2ML IJ SOLN: 4.0000 mg | Freq: Four times a day (QID) | INTRAMUSCULAR | Status: DC | PRN

## 2022-04-16 MED ORDER — PRENATAL MULTIVITAMIN CH
1.0000 | ORAL_TABLET | Freq: Every day | ORAL | Status: DC
  Administered 2022-04-17 – 2022-04-18 (×2): 1 via ORAL
  Filled 2022-04-16 (×2): qty 1

## 2022-04-16 MED ORDER — OXYCODONE-ACETAMINOPHEN 5-325 MG PO TABS: 2.0000 | ORAL_TABLET | ORAL | Status: DC | PRN

## 2022-04-16 MED ORDER — TETANUS-DIPHTH-ACELL PERTUSSIS 5-2.5-18.5 LF-MCG/0.5 IM SUSY: 0.5000 mL | PREFILLED_SYRINGE | Freq: Once | INTRAMUSCULAR | Status: DC

## 2022-04-16 MED ORDER — ONDANSETRON HCL 4 MG/2ML IJ SOLN
4.0000 mg | INTRAMUSCULAR | Status: DC | PRN
  Administered 2022-04-16: 4 mg via INTRAVENOUS
  Filled 2022-04-16: qty 2

## 2022-04-16 MED ORDER — LACTATED RINGERS IV SOLN: 500.0000 mL | Freq: Once | INTRAVENOUS | Status: DC

## 2022-04-16 MED ORDER — OXYCODONE HCL 5 MG PO TABS: 5.0000 mg | ORAL_TABLET | ORAL | Status: DC | PRN

## 2022-04-16 MED ORDER — SOD CITRATE-CITRIC ACID 500-334 MG/5ML PO SOLN: 30.0000 mL | ORAL | Status: DC | PRN

## 2022-04-16 MED ORDER — OXYTOCIN-SODIUM CHLORIDE 30-0.9 UT/500ML-% IV SOLN
2.5000 [IU]/h | INTRAVENOUS | Status: DC
  Filled 2022-04-16: qty 500

## 2022-04-16 MED ORDER — COCONUT OIL OIL: 1.0000 | TOPICAL_OIL | Status: DC | PRN

## 2022-04-16 MED ORDER — DIPHENHYDRAMINE HCL 50 MG/ML IJ SOLN: 12.5000 mg | INTRAMUSCULAR | Status: DC | PRN

## 2022-04-16 MED ORDER — FENTANYL CITRATE (PF) 100 MCG/2ML IJ SOLN: 50.0000 ug | INTRAMUSCULAR | Status: DC | PRN

## 2022-04-16 MED ORDER — TERBUTALINE SULFATE 1 MG/ML IJ SOLN: 0.2500 mg | Freq: Once | INTRAMUSCULAR | Status: DC | PRN

## 2022-04-16 MED ORDER — LACTATED RINGERS IV SOLN: INTRAVENOUS | Status: DC

## 2022-04-16 MED ORDER — MISOPROSTOL 25 MCG QUARTER TABLET
25.0000 ug | ORAL_TABLET | ORAL | Status: DC | PRN
  Administered 2022-04-16: 25 ug via VAGINAL
  Filled 2022-04-16 (×2): qty 1

## 2022-04-16 MED ORDER — LIDOCAINE HCL (PF) 1 % IJ SOLN: 30.0000 mL | INTRAMUSCULAR | Status: DC | PRN

## 2022-04-16 MED ORDER — EPHEDRINE 5 MG/ML INJ: 10.0000 mg | INTRAVENOUS | Status: DC | PRN

## 2022-04-16 MED ORDER — IBUPROFEN 600 MG PO TABS
600.0000 mg | ORAL_TABLET | Freq: Four times a day (QID) | ORAL | Status: DC
  Administered 2022-04-17 – 2022-04-18 (×7): 600 mg via ORAL
  Filled 2022-04-16 (×8): qty 1

## 2022-04-16 MED ORDER — PHENYLEPHRINE 80 MCG/ML (10ML) SYRINGE FOR IV PUSH (FOR BLOOD PRESSURE SUPPORT): 80.0000 ug | PREFILLED_SYRINGE | INTRAVENOUS | Status: DC | PRN

## 2022-04-16 MED ORDER — FENTANYL-BUPIVACAINE-NACL 0.5-0.125-0.9 MG/250ML-% EP SOLN
12.0000 mL/h | EPIDURAL | Status: DC | PRN
  Administered 2022-04-16: 12 mL/h via EPIDURAL
  Filled 2022-04-16: qty 250

## 2022-04-16 MED ORDER — SIMETHICONE 80 MG PO CHEW: 80.0000 mg | CHEWABLE_TABLET | ORAL | Status: DC | PRN

## 2022-04-16 MED ORDER — OXYCODONE-ACETAMINOPHEN 5-325 MG PO TABS: 1.0000 | ORAL_TABLET | ORAL | Status: DC | PRN

## 2022-04-16 MED ORDER — WITCH HAZEL-GLYCERIN EX PADS: 1.0000 | MEDICATED_PAD | CUTANEOUS | Status: DC | PRN

## 2022-04-16 MED ORDER — ZOLPIDEM TARTRATE 5 MG PO TABS: 5.0000 mg | ORAL_TABLET | Freq: Every evening | ORAL | Status: DC | PRN

## 2022-04-16 MED ORDER — DIPHENHYDRAMINE HCL 25 MG PO CAPS: 25.0000 mg | ORAL_CAPSULE | Freq: Four times a day (QID) | ORAL | Status: DC | PRN

## 2022-04-16 MED ORDER — LIDOCAINE HCL (PF) 1 % IJ SOLN
INTRAMUSCULAR | Status: DC | PRN
  Administered 2022-04-16 (×2): 4 mL via EPIDURAL

## 2022-04-16 MED ORDER — ACETAMINOPHEN 325 MG PO TABS
650.0000 mg | ORAL_TABLET | ORAL | Status: DC | PRN
  Administered 2022-04-17: 650 mg via ORAL
  Filled 2022-04-16: qty 2

## 2022-04-16 MED ORDER — OXYCODONE HCL 5 MG PO TABS: 10.0000 mg | ORAL_TABLET | ORAL | Status: DC | PRN

## 2022-04-16 MED ORDER — LACTATED RINGERS IV SOLN: 500.0000 mL | INTRAVENOUS | Status: DC | PRN

## 2022-04-16 MED ORDER — ACETAMINOPHEN 325 MG PO TABS: 650.0000 mg | ORAL_TABLET | ORAL | Status: DC | PRN

## 2022-04-16 MED ORDER — SENNOSIDES-DOCUSATE SODIUM 8.6-50 MG PO TABS
2.0000 | ORAL_TABLET | Freq: Every day | ORAL | Status: DC
  Administered 2022-04-17 – 2022-04-18 (×2): 2 via ORAL
  Filled 2022-04-16 (×2): qty 2

## 2022-04-16 MED ORDER — BENZOCAINE-MENTHOL 20-0.5 % EX AERO
1.0000 | INHALATION_SPRAY | CUTANEOUS | Status: DC | PRN
  Administered 2022-04-16: 1 via TOPICAL
  Filled 2022-04-16: qty 56

## 2022-04-16 MED ORDER — OXYTOCIN-SODIUM CHLORIDE 30-0.9 UT/500ML-% IV SOLN: 1.0000 m[IU]/min | INTRAVENOUS | Status: DC

## 2022-04-16 MED ORDER — ONDANSETRON HCL 4 MG PO TABS: 4.0000 mg | ORAL_TABLET | ORAL | Status: DC | PRN

## 2022-04-16 MED ORDER — DIBUCAINE (PERIANAL) 1 % EX OINT: 1.0000 | TOPICAL_OINTMENT | CUTANEOUS | Status: DC | PRN

## 2022-04-16 NOTE — Anesthesia Preprocedure Evaluation (Signed)
Anesthesia Evaluation  Patient identified by MRN, date of birth, ID band Patient awake    Reviewed: Allergy & Precautions, Patient's Chart, lab work & pertinent test results  History of Anesthesia Complications Negative for: history of anesthetic complications  Airway Mallampati: II  TM Distance: >3 FB Neck ROM: Full    Dental no notable dental hx.    Pulmonary neg pulmonary ROS   Pulmonary exam normal        Cardiovascular hypertension (gestational), Normal cardiovascular exam     Neuro/Psych negative neurological ROS  negative psych ROS   GI/Hepatic negative GI ROS, Neg liver ROS,,,  Endo/Other  negative endocrine ROS    Renal/GU negative Renal ROS  negative genitourinary   Musculoskeletal negative musculoskeletal ROS (+)    Abdominal   Peds  Hematology negative hematology ROS (+)   Anesthesia Other Findings Day of surgery medications reviewed with patient.  Reproductive/Obstetrics (+) Pregnancy                              Anesthesia Physical Anesthesia Plan  ASA: 2  Anesthesia Plan: Epidural   Post-op Pain Management:    Induction:   PONV Risk Score and Plan: Treatment may vary due to age or medical condition  Airway Management Planned: Natural Airway  Additional Equipment: Fetal Monitoring  Intra-op Plan:   Post-operative Plan:   Informed Consent: I have reviewed the patients History and Physical, chart, labs and discussed the procedure including the risks, benefits and alternatives for the proposed anesthesia with the patient or authorized representative who has indicated his/her understanding and acceptance.       Plan Discussed with:   Anesthesia Plan Comments:          Anesthesia Quick Evaluation

## 2022-04-16 NOTE — H&P (Signed)
Laura [name] is a 32 y.o. female presenting for IOL for [place]  32 yo G2P0010 @ 37+3 presents for IOL for [place] for MFM recommendations. BPs in office overall normotensive. However, pt was noted to have elevated BPs in MFMs office. Home BPs elevated and MFM recommended delivery at 37 weeks. Pt has a h/o 18 week loss d/t PPROM. OB History     Gravida  2   Para  0   Term  0   Preterm  0   AB  1   Living         SAB  1   IAB  0   Ectopic  0   Multiple      Live Births             Past Medical History:  Diagnosis Date   Medical history non-contributory    Pregnancy induced hypertension    Past Surgical History:  Procedure Laterality Date   BREAST SURGERY     breast bx left breast   Family History: family history includes Cancer in her mother; Diabetes in her father, maternal grandfather, maternal grandmother, and mother; Hyperlipidemia in her maternal grandfather, maternal grandmother, paternal grandfather, and paternal grandmother. Social History:  reports that she has never smoked. She has never used smokeless tobacco. She reports that she does not drink alcohol and does not use drugs.     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 rate 15, height 5\' 2"  (1.575 m), weight 91.3 kg, last menstrual period 07/27/2021, SpO2 99 %. Exam Physical Exam  AOx3, NAD Abdomen gravid, soft FHR reactive, cat 1 tracing Prenatal  labs: ABO, Rh: --/--/A POS (03/20 0040) Antibody: NEG (03/20 0040) Rubella: Immune (09/06 0000) RPR: NON REACTIVE (03/20 0042)  HBsAg: Negative (09/06 0000)  HIV: Non-reactive (09/06 0000)  GBS: Negative/-- (03/05 0000)   Assessment/Plan: 1) Admit 2) misoprostal 74mcg Q 4hr. AROM/pit prn 3) Epidural on request   Vanessa Kick 04/16/2022, 2:09 PM

## 2022-04-16 NOTE — Anesthesia Procedure Notes (Signed)
Epidural Patient location during procedure: OB Start time: 04/16/2022 8:42 AM End time: 04/16/2022 8:45 AM  Staffing Anesthesiologist: Brennan Bailey, MD Performed: anesthesiologist   Preanesthetic Checklist Completed: patient identified, IV checked, risks and benefits discussed, monitors and equipment checked, pre-op evaluation and timeout performed  Epidural Patient position: sitting Prep: DuraPrep and site prepped and draped Patient monitoring: continuous pulse ox, blood pressure and heart rate Approach: midline Location: L3-L4 Injection technique: LOR air  Needle:  Needle type: Tuohy  Needle gauge: 17 G Needle length: 9 cm Needle insertion depth: 6 cm Catheter type: closed end flexible Catheter size: 19 Gauge Catheter at skin depth: 11 cm Test dose: negative and Other (1% lidocaine)  Assessment Events: blood not aspirated, no cerebrospinal fluid, injection not painful, no injection resistance, no paresthesia and negative IV test  Additional Notes Patient identified. Risks, benefits, and alternatives discussed with patient including but not limited to bleeding, infection, nerve damage, paralysis, failed block, incomplete pain control, headache, blood pressure changes, nausea, vomiting, reactions to medication, itching, and postpartum back pain. Confirmed with bedside nurse the patient's most recent platelet count. Confirmed with patient that they are not currently taking any anticoagulation, have any bleeding history, or any family history of bleeding disorders. Patient expressed understanding and wished to proceed. All questions were answered. Sterile technique was used throughout the entire procedure. Please see nursing notes for vital signs.   Crisp LOR after one needle redirection. Test dose was given through epidural catheter and negative prior to continuing to dose epidural or start infusion. Warning signs of high block given to the patient including shortness of breath,  tingling/numbness in hands, complete motor block, or any concerning symptoms with instructions to call for help. Patient was given instructions on fall risk and not to get out of bed. All questions and concerns addressed with instructions to call with any issues or inadequate analgesia.  Reason for block:procedure for pain

## 2022-04-16 NOTE — Lactation Note (Signed)
This note was copied from a baby's chart. Lactation Consultation Note  Patient Name: Laura [name] [id number] Date: 04/16/2022 Age:32 hours Reason for consult: Initial assessment;1st time breastfeeding;Early term 37-38.6wks  P1, ETI female infant, Per Birth Parent, infant been sleepy at breast and not sustain her latch. LC assisted with latching infant on her right breast using the football hold position, infant was on and off the breast for 5 minutes and then became sleepy, skin to skin. [facility] taught Birth Parent hand expression using breast model and Birth Parent self expressed , infant was given 8 mls of colostrum by spoon. Birth Parent will continue to work on latching infant at the breast and will continue to ask RN/LC for further latch assistance if needed. Birth Parent plans to continue to hand express and give infant back EBM by spoon today if infant doesn't latch or latch for short interval of 5 minutes or less. LC gave Birth Parent, handout "Supplementing with Breastfeeding", Birth Parent will continue to BF infant by cues, 8 to 12+ times within 24 hours, STS.  LC discussed infant's input and output, the importance of maternal diet, rest and hydration. Birth Parent was made aware of O/P services, breastfeeding support groups, community resources, and our phone # for post-discharge questions.    Maternal Data Has patient been taught Hand Expression?: Yes  Feeding Mother's Current Feeding Choice: Breast Milk  LATCH Score Latch: Repeated attempts needed to sustain latch, nipple held in mouth throughout feeding, stimulation needed to elicit sucking reflex.  Audible Swallowing: A few with stimulation  Type of Nipple: Everted at rest and after stimulation  Comfort (Breast/Nipple): Soft / non-tender  Hold (Positioning): Assistance needed to correctly position infant at breast and maintain latch.  LATCH Score: 7   Lactation Tools Discussed/Used     Interventions Interventions: Breast feeding basics reviewed;Adjust position;Assisted with latch;Support pillows;Skin to skin;Position options;Hand express;Breast massage;Breast compression;Expressed milk;Education;LC Services brochure  Discharge Pump: DEBP;Hands Free;Personal (Per Birth Parent, she has Elvie DEBP at home.)  Consult Status Consult Status: Follow-up Date: 04/17/22 Follow-up type: In-patient    Eulis Canner 04/16/2022, 11:39 PM

## 2022-04-17 LAB — CBC
HCT: 34.2 % — ABNORMAL LOW (ref 36.0–46.0)
Hemoglobin: 11.1 g/dL — ABNORMAL LOW (ref 12.0–15.0)
MCH: 25.4 pg — ABNORMAL LOW (ref 26.0–34.0)
MCHC: 32.5 g/dL (ref 30.0–36.0)
MCV: 78.3 fL — ABNORMAL LOW (ref 80.0–100.0)
Platelets: 185 10*3/uL (ref 150–400)
RBC: 4.37 MIL/uL (ref 3.87–5.11)
RDW: 14.7 % (ref 11.5–15.5)
WBC: 13.8 10*3/uL — ABNORMAL HIGH (ref 4.0–10.5)
nRBC: 0 % (ref 0.0–0.2)

## 2022-04-17 NOTE — Lactation Note (Signed)
This note was copied from a baby's chart. Lactation Consultation Note  Patient Name: Laura [name] [id number] Date: 04/17/2022 Age:32 hours Reason for consult: Breastfeeding assistance;1st time breastfeeding;Follow-up assessment;Infant weight loss (-3.28% weight loss)  Per Birth parent,  infant been BF for short intervals today. Been 4 hours since last feeding. LC assisted with latching infant at the breast, Birth Parent latched infant on her right breast using the football hold position, unwrapping infant and BF skin to skin. , infant was on and off breast for 10 minutes. Afterwards Birth Parent expressed 9 mls of colostrum and spoon fed infant. Per Birth Parent, earlier today if infant did not latch she has been giving infant her EBM. LC discussed with Birth Parent to continue to call RN/LC for latch assistance if needed. BF infant by cues, 8 to 12+ times within 24 hours, STS. Do skin to skin if infant is sleepy to wake infant up, maybe spoon fed few mls of colostrum and then latch.   Maternal Data    Feeding Mother's Current Feeding Choice: Breast Milk  LATCH Score Latch: Repeated attempts needed to sustain latch, nipple held in mouth throughout feeding, stimulation needed to elicit sucking reflex.  Audible Swallowing: A few with stimulation  Type of Nipple: Everted at rest and after stimulation  Comfort (Breast/Nipple): Soft / non-tender  Hold (Positioning): Assistance needed to correctly position infant at breast and maintain latch.  LATCH Score: 7   Lactation Tools Discussed/Used Tools: Pump;Flanges Flange Size: 21 Breast pump type: Manual Pump Education: Setup, frequency, and cleaning;Milk Storage Reason for Pumping: Birth Parent give infant back EBM as she continue to work on latching infant at the breast. Pumping frequency: PRN  Interventions Interventions: Support pillows;Position options;Adjust position;Skin to skin;Assisted with latch;Hand  express;Expressed milk;Education;Hand pump  Discharge    Consult Status Consult Status: Follow-up Date: 04/18/22 Follow-up type: In-patient    Eulis Canner 04/17/2022, 7:25 PM

## 2022-04-17 NOTE — Anesthesia Postprocedure Evaluation (Signed)
Anesthesia Post Note  Patient: Laura [name]  Procedure(s) Performed: AN AD [place]     Patient location during evaluation: Mother Baby Anesthesia Type: Epidural Level of consciousness: awake and alert and oriented Pain management: satisfactory to patient Vital Signs Assessment: post-procedure vital signs reviewed and stable Respiratory status: respiratory function stable Cardiovascular status: stable Postop Assessment: no headache, no backache, epidural receding, patient able to bend at knees, no signs of nausea or vomiting, adequate PO intake and able to ambulate Anesthetic complications: no   No notable events documented.  Last Vitals:  Vitals:   04/17/22 0200 04/17/22 0602  BP: 114/77 124/82  Pulse: 100 91  Resp: 16 16  Temp: 37.1 C 36.8 C  SpO2: 98% 100%    Last Pain:  Vitals:   04/17/22 0602  TempSrc: Oral  PainSc:    Pain Goal:                   Laura [name]

## 2022-04-17 NOTE — Progress Notes (Signed)
Post Partum Day 1 Subjective: no complaints, up ad lib, voiding, tolerating PO, + flatus, and lochia mild. She denies any complaints - no HA, CP, SOB or lightheadedness. Bonding well with baby   Objective: Blood pressure 124/82, pulse 91, temperature 98.3 F (36.8 C), temperature source Oral, resp. rate 16, height 5\' 2"  (1.575 m), weight 91.3 kg, last menstrual period 07/27/2021, SpO2 100 %, unknown if currently breastfeeding.  Physical Exam:  General: alert, cooperative, and no distress Lochia: appropriate Uterine Fundus: firm Incision: n/a DVT Evaluation: No evidence of DVT seen on physical exam.  Recent Labs    04/16/22 0042 04/17/22 0629  HGB 10.6* 11.1*  HCT 32.3* 34.2*    Assessment/Plan: Plan for discharge tomorrow and Breastfeeding   LOS: 1 day   Laura [name] W Blease Capaldi, DO 04/17/2022, 11:54 AM

## 2022-04-17 NOTE — Lactation Note (Addendum)
This note was copied from a baby's chart. Lactation Consultation Note  Patient Name: Laura [name] [id number] Date: 04/17/2022 Age:32 hours LC observed in follow sheet was almost 4 hours since infant last BF. Birth Parent entered room and Birth Parent had visitors, [place] ask Birth Parent to call [facility] for latch assistance, Per Birth Parent infant is sleeping, [place] mention infant tummy is small, infant eat's frequent and often and infant should not go past 3  1/2 hours without feeding.    Maternal Data    Feeding    LATCH Score                    Lactation Tools Discussed/Used    Interventions    Discharge    Consult Status      Eulis Canner 04/17/2022, 6:45 PM

## 2022-04-18 MED ORDER — IBUPROFEN 600 MG PO TABS: 600.0000 mg | ORAL_TABLET | Freq: Four times a day (QID) | ORAL | 0 refills | Status: AC

## 2022-04-18 NOTE — Lactation Note (Signed)
This note was copied from a baby's chart. Lactation Consultation Note  Patient Name: Laura [name] [id number] Date: 04/18/2022 Age:32 hours Reason for consult: Follow-up assessment;Early term 44-38.6wks Consult was done in Spanish: LC in to room, parents are hoping to be discharged today. Birthing parent reports good feedings and deny any pain/discomfort. Discussed normal behavior and patterns, voids and stools as signs good intake, pumping, clusterfeeding, skin to skin. Talked about milk coming into volume and managing engorgement. Birthing parent reports some breast firmness. Reviewed hand expression and nipple care. Reviewed local resources available after discharge.   Plan: 1-Aim for a deep, comfortable latch, breastfeeding on demand or 8-12 times in 24h period. 2-Hand express/pump as needed for supplementation 3-Encouraged birthing parent rest, hydration and food intake.   Contact LC as needed for feeds/support/concerns/questions. All questions answered at this time. Reviewed [place] brochure.     Maternal Data Has patient been taught Hand Expression?: Yes Does the patient have breastfeeding experience prior to this delivery?: No  Feeding Mother's Current Feeding Choice: Breast Milk  LATCH Score No latch observed during this encounter.   Lactation Tools Discussed/Used Tools: Pump;Flanges Flange Size: 21 Breast pump type: Manual;Other (comment) (Personal Elvie) Pump Education: Milk Storage;Setup, frequency, and cleaning Reason for Pumping: stimulation and supplementation Pumping frequency: as needed Pumped volume: 7 mL (collected prior to [facility] visit)  Interventions Interventions: Breast feeding basics reviewed;Skin to skin;Breast massage;Hand express;Hand pump;Expressed milk;Education  Discharge Discharge Education: Warning signs for feeding baby;Engorgement and breast care Pump: Manual;Hands Free;Personal WIC Program: No  Consult Status Consult Status:  Complete Date: 04/18/22 Follow-up type: Call as needed    [place] 04/18/2022, 9:44 AM

## 2022-04-18 NOTE — Progress Notes (Signed)
Post Partum Day 2 Subjective: no complaints, up ad lib, and tolerating PO  Ready for d/c home  Objective: Blood pressure 117/73, pulse 81, temperature 98.1 F (36.7 C), temperature source Oral, resp. rate 18, height 5\' 2"  (1.575 m), weight 91.3 kg, last menstrual period 07/27/2021, SpO2 98 %, unknown if currently breastfeeding.  Physical Exam:  General: alert and cooperative Lochia: appropriate Uterine Fundus: firm   Recent Labs    04/16/22 0042 04/17/22 0629  HGB 10.6* 11.1*  HCT 32.3* 34.2*    Assessment/Plan: Discharge home BP have been normotensive her entire hospital stay   LOS: 2 days   Logan Bores, MD 04/18/2022, 10:25 AM

## 2022-04-18 NOTE — Discharge Summary (Signed)
Postpartum Discharge Summary       Patient Name: Laura [name] DOB: 1990/08/20 MRN: [id number]  Date of admission: 04/16/2022 Delivery date:04/16/2022  Delivering provider: Vanessa Kick  Date of discharge: 04/18/2022  Admitting diagnosis: Gestational hypertension affecting third pregnancy [O13.9] Spontaneous vaginal delivery [O80] Intrauterine pregnancy: [redacted]w[redacted]d     Secondary diagnosis:  Principal Problem:   Gestational hypertension affecting third pregnancy Active Problems:   Spontaneous vaginal delivery  Additional problems: none    Discharge diagnosis: Term Pregnancy Delivered                                              Post partum procedures: none Augmentation: AROM, Pitocin, and Cytotec Complications: None  Hospital course: Induction of Labor With Vaginal Delivery   32 y.o. yo G2P1011 at [redacted]w[redacted]d was admitted to the hospital 04/16/2022 for induction of labor.  Indication for induction: Gestational hypertension.  Patient had an uncomplicated labor course.  She was induced for elevated BP noted at [place] appointment but remained normotensive during her hospital stay and BP day of discharge was 117/73.  Membrane Rupture Time/Date: 10:11 AM ,04/16/2022   Delivery Method:Vaginal, Spontaneous  Episiotomy: None  Lacerations:  1st degree  Details of delivery can be found in separate delivery note.  Patient had a postpartum course complicated by nothing. Patient is discharged home 04/18/22.  Newborn Data: Birth date:04/16/2022  Birth time:3:00 PM  Gender:Female  Living status:Living  Apgars:8 ,9  Weight:2900 g   Magnesium Sulfate received: No   Physical exam  Vitals:   04/17/22 0602 04/17/22 1100 04/17/22 2129 04/18/22 0534  BP: 124/82 123/73 128/82 117/73  Pulse: 91 93 90 81  Resp: 16 18 18 18   Temp: 98.3 F (36.8 C) 98.5 F (36.9 C) 98.2 F (36.8 C) 98.1 F (36.7 C)  TempSrc: Oral Oral Oral Oral  SpO2: 100% 98% 99% 98%  Weight:      Height:       General:  alert Lochia: appropriate Uterine Fundus: firm  Labs: Lab Results  Component Value Date   WBC 13.8 (H) 04/17/2022   HGB 11.1 (L) 04/17/2022   HCT 34.2 (L) 04/17/2022   MCV 78.3 (L) 04/17/2022   PLT 185 04/17/2022      Latest Ref Rng & Units 04/16/2022   12:42 AM  CMP  Glucose 70 - 99 mg/dL 96   BUN 6 - 20 mg/dL 8   Creatinine 0.44 - 1.00 mg/dL 0.69   Sodium 135 - 145 mmol/L 135   Potassium 3.5 - 5.1 mmol/L 3.9   Chloride 98 - 111 mmol/L 107   CO2 22 - 32 mmol/L 20   Calcium 8.9 - 10.3 mg/dL 9.0   Total Protein 6.5 - 8.1 g/dL 6.3   Total Bilirubin 0.3 - 1.2 mg/dL 0.4   Alkaline Phos 38 - 126 U/L 65   AST 15 - 41 U/L 18   ALT 0 - 44 U/L 15    Edinburgh Score:    04/16/2022    4:40 PM  Edinburgh Postnatal Depression Scale Screening Tool  I have been able to laugh and see the funny side of things. 0  I have looked forward with enjoyment to things. 0  I have blamed myself unnecessarily when things went wrong. 0  I have been anxious or worried for no good reason. 0  I have felt  scared or panicky for no good reason. 0  Things have been getting on top of me. 3  I have been so unhappy that I have had difficulty sleeping. 0  I have felt sad or miserable. 0  I have been so unhappy that I have been crying. 0  The thought of harming myself has occurred to me. 0  Edinburgh Postnatal Depression Scale Total 3     After visit meds:  Allergies as of 04/18/2022   No Known Allergies      Medication List     STOP taking these medications    cyclobenzaprine 10 MG tablet Commonly known as: FLEXERIL   doxylamine (Sleep) 25 MG tablet Commonly known as: UNISOM   Excedrin Migraine 250-250-65 MG tablet Generic drug: aspirin-acetaminophen-caffeine   pyridOXINE 25 MG tablet Commonly known as: VITAMIN B6       TAKE these medications    folic acid 1 MG tablet Commonly known as: FOLVITE Take 1 mg by mouth daily.   ibuprofen 600 MG tablet Commonly known as: ADVIL Take 1  tablet (600 mg total) by mouth every 6 (six) hours.         Discharge home in stable condition Infant Feeding: Breast Infant Disposition:home with mother Discharge instruction: per After Visit Summary and Postpartum booklet. Activity: Advance as tolerated. Pelvic rest for 6 weeks.  Diet: routine diet Future Appointments:No future appointments. Follow up Visit:  Follow-up Information     Ob/Gyn, [facility]. Schedule an appointment as soon as possible for a visit in 4 week(s).   Why: Routine postpartum Contact information: [place] [address] [phone]                  Please schedule this patient for a In person postpartum visit in 4 weeks with the following provider: MD.  Delivery mode:  Vaginal, Spontaneous  Anticipated Birth Control:  Unsure   04/18/2022 Logan Bores, MD

## 2022-04-19 ENCOUNTER — Ambulatory Visit (HOSPITAL_COMMUNITY): Payer: Self-pay

## 2022-04-19 NOTE — Lactation Note (Signed)
This note was copied from a baby's chart. Lactation Consultation Note  Patient Name: Laura [name] [id number] Date: 04/19/2022 Age:32 hours Reason for consult: Follow-up assessment;Early term 37-38.6wks  LC in to room. Parents are being discharged. Birthing parent reports good feedings and deny any pain/discomfort. No questions at this time. Contact LC as needed for feeds/support/concerns/questions.   Feeding Mother's Current Feeding Choice: Breast Milk  LATCH Score No latch observed during this encounter.    Lactation Tools Discussed/Used Tools: Pump  Interventions Interventions: Education  Consult Status Consult Status: Complete Date: 04/19/22 Follow-up type: Call as needed    [place] 04/19/2022, 6:35 PM

## 2022-04-19 NOTE — Lactation Note (Signed)
This note was copied from a baby's chart. Lactation Consultation Note  Patient Name: Laura [name] [id number] Date: 04/19/2022 Age:31 hours Reason for consult: RN request;Early term 37-38.6wks;Primapara Baby not staying on beast very long at a time. Maybe because mom's milk is coming in. ? Is let down to fast? Mom was giving BM that she pumped in Dr. Saul Fordyce bottle. Baby leaking around mouth not obtaining seal. But parents don't have bottle parts in bottle correctly. Gave purple nipple to use and baby took BM well. Baby does at times have chomping action w/sucking. Baby did leak w/bottle. Purple nipple given and leaked less during feeding. Mom's milk is coming in. Offered to set up DEBP and mom politely declined stating the hand pump is plenty and doing OK. LC suggested for mom to BF then give 20-30 ML BM after BF q3hr. But can go to breast as needed but supplement q3hr.  Maternal Data Has patient been taught Hand Expression?: Yes  Feeding    LATCH Score Latch: Grasps breast easily, tongue down, lips flanged, rhythmical sucking.  Audible Swallowing: Spontaneous and intermittent  Type of Nipple: Everted at rest and after stimulation  Comfort (Breast/Nipple): Soft / non-tender  Hold (Positioning): Assistance needed to correctly position infant at breast and maintain latch.  LATCH Score: 9   Lactation Tools Discussed/Used Breast pump type: Manual Pump Education: Setup, frequency, and cleaning Reason for Pumping: supplementation Pumping frequency: q3hr Pumped volume: 30 mL  Interventions Interventions: Breast compression;Skin to skin;Position options;Support pillows;Breast feeding basics reviewed;Adjust position  Discharge    Consult Status Consult Status: Follow-up Date: 04/19/22 Follow-up type: In-patient    Laura [name], [place]ta [place] 04/19/2022, 1:07 AM

## 2022-04-29 ENCOUNTER — Telehealth (HOSPITAL_COMMUNITY): Payer: Self-pay | Admitting: *Deleted

## 2022-04-29 NOTE — Telephone Encounter (Signed)
Attempted Hospital Discharge Follow-Up Call.  Left voice mail requesting that patient return RN's phone call if patient has any concerns or questions.  

## 2022-09-23 IMAGING — US US MFM OB LIMITED
1 series · 15 of 28 positions shown · non-contrast
Comparison: none

[Series 1: us mfm ob limited · 43 acquisitions, 15 frames shown]
[im 1/43]
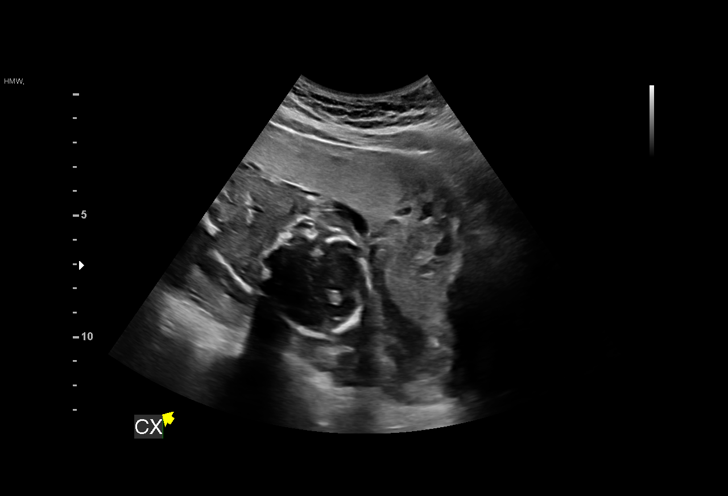
[im 4/43]
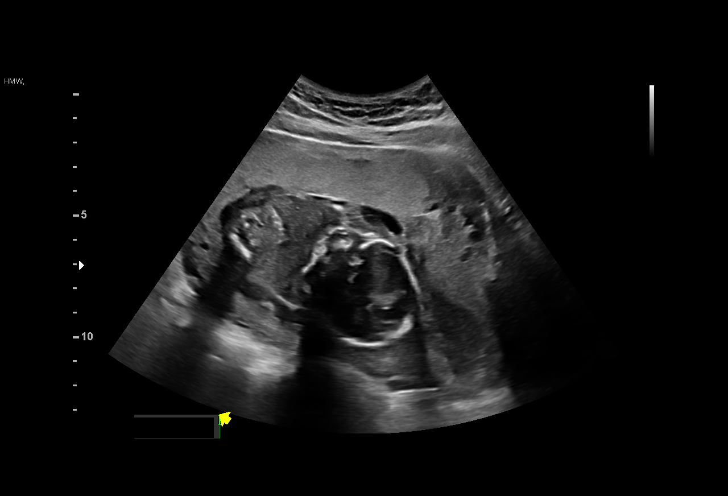
[im 7/43]
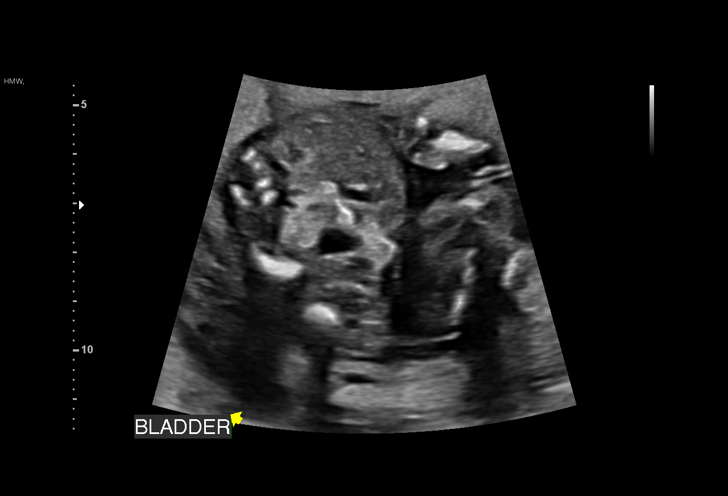
[im 10/43]
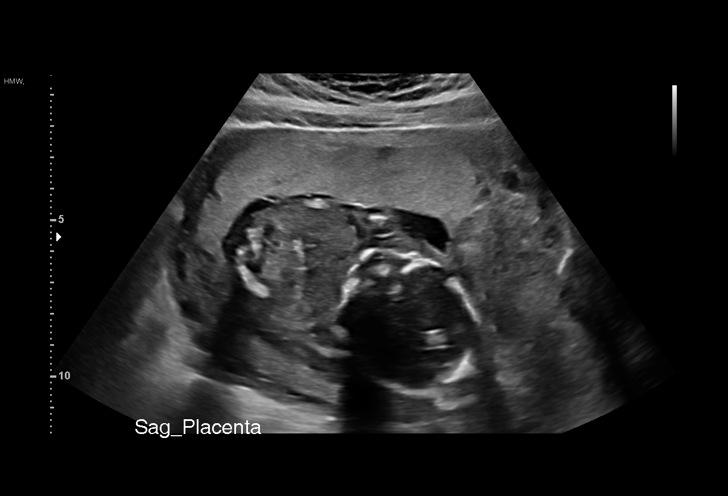
[im 13/43]
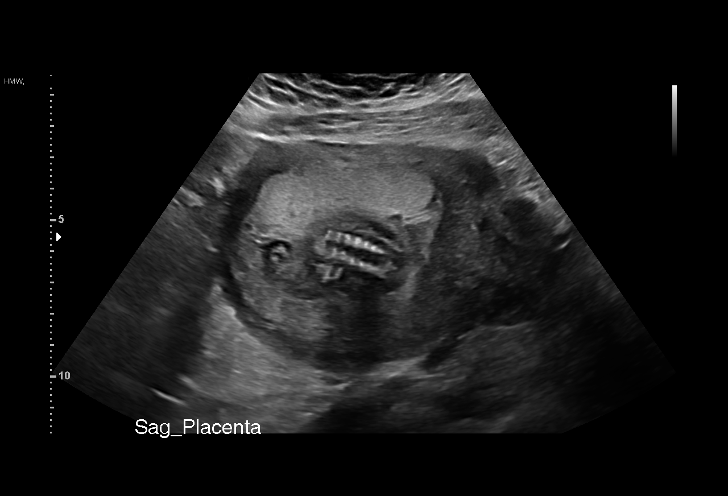
[im 16/43]
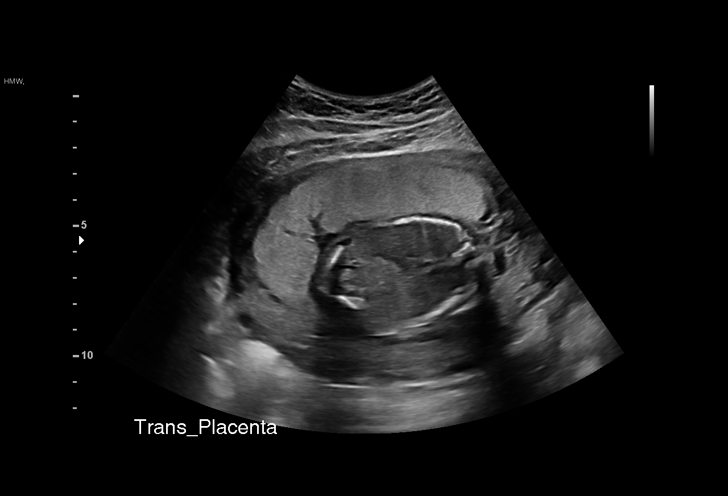
[im 19/43]
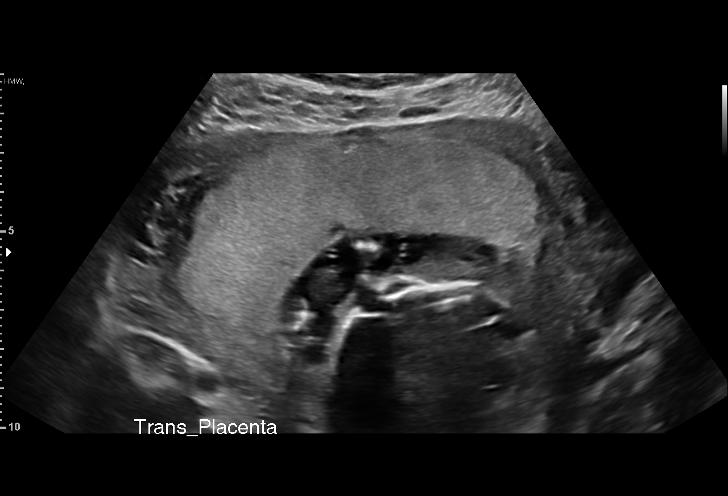
[im 22/43]
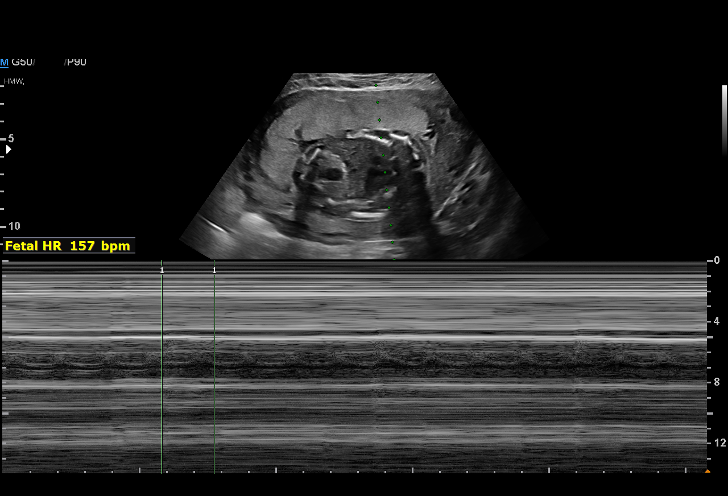
[im 24/43]
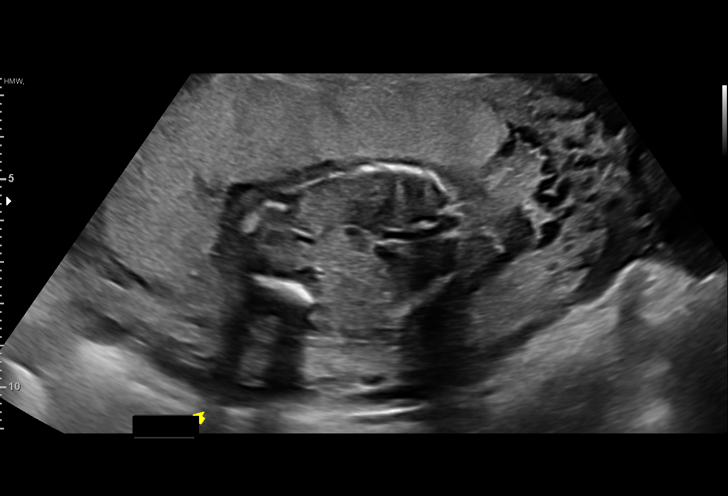
[im 27/43]
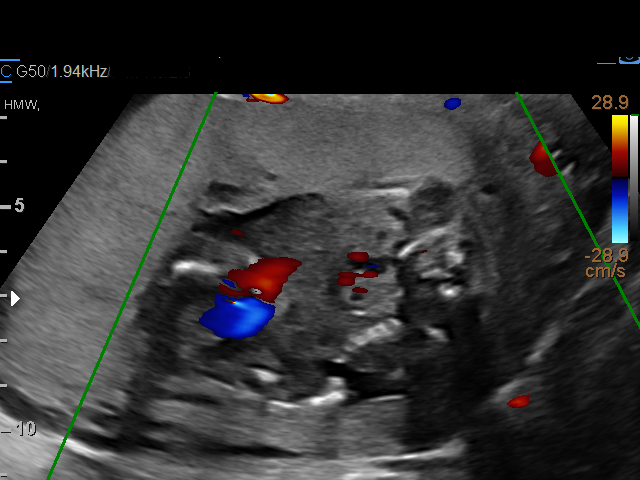
[im 30/43]
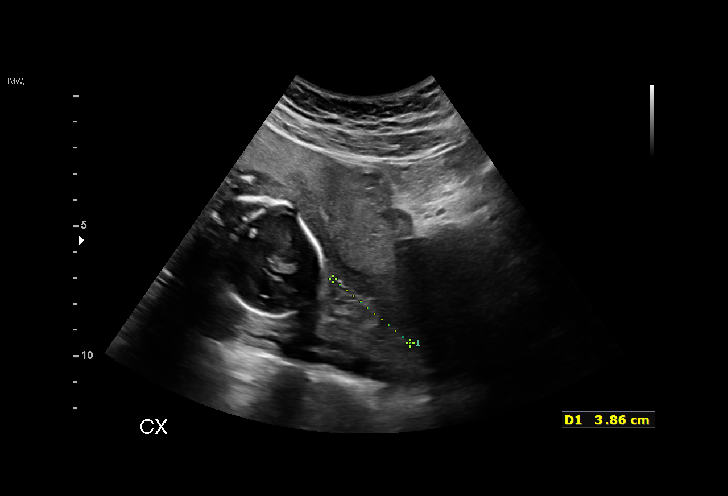
[im 33/43]
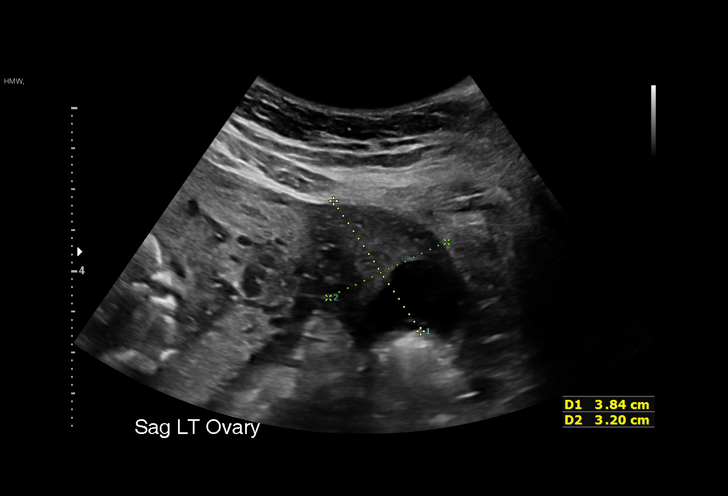
[im 36/43]
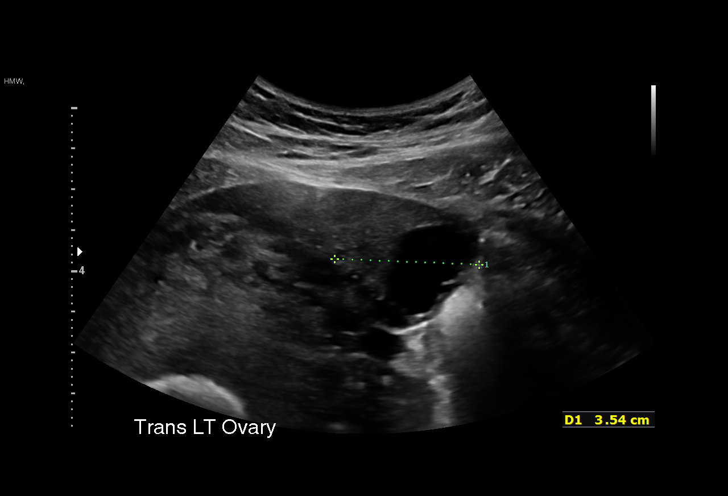
[im 39/43]
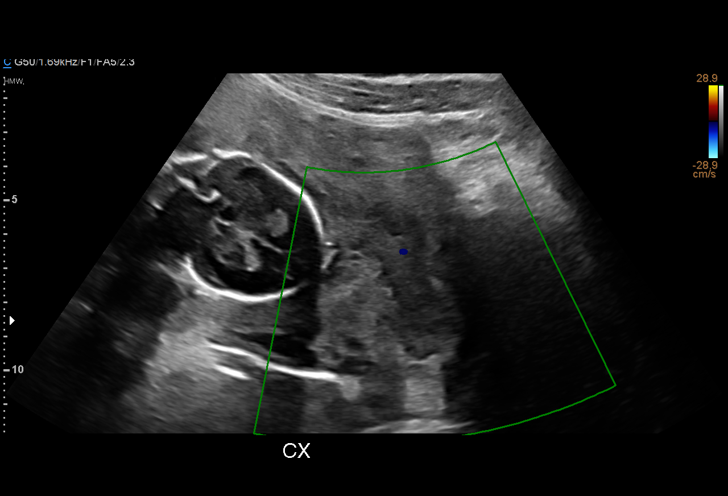
[im 43/43]
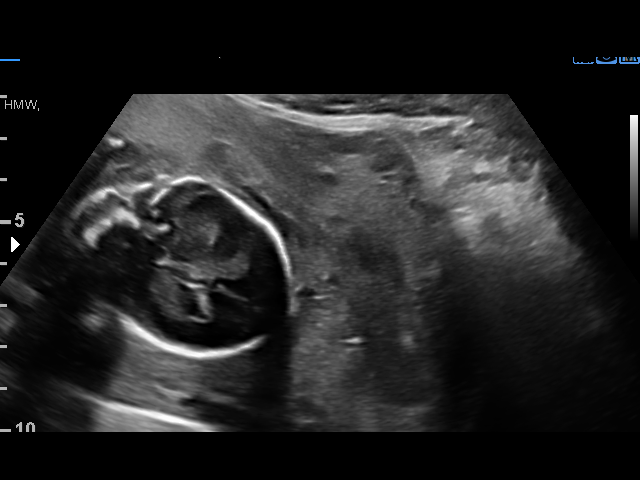

[15 of 28 positions shown; findings below may reference images not displayed]

[REDACTED]
 Referred By:      SEMRANA              [facility]
                   [facility]                              [HOSPITAL]

 1  US MFM OB LIMITED                     76815.01    ASTO

Indications

 Vaginal bleeding in pregnancy, second
 trimester
 18 weeks gestation of pregnancy
Fetal Evaluation

 Num Of Fetuses:         1
 Fetal Heart Rate(bpm):  157
 Cardiac Activity:       Observed
 Presentation:           Cephalic
 Placenta:               Anterior
 P. Cord Insertion:      Marginal insertion

 Amniotic Fluid
 AFI FV:      Oligohydramnios

                             Largest Pocket(cm)

Biometry

 FL:         26  mm     G. Age:  17w 6d         35  %
OB History

 Gravidity:    1         Term:   0        Prem:   0        SAB:   0
 TOP:          0       Ectopic:  0        Living: 0
Gestational Age
 U/S Today:     17w 6d                                        EDD:   05/18/20
 Best:          18w 1d     Det. By:  Early Ultrasound         EDD:   05/16/20
Anatomy

 Thoracic:              Appears normal         Kidneys:                Appear normal
 Stomach:               Appears normal, left   Bladder:                Appears normal
                        sided
 Abdomen:               Echogenic Bowel
                        prev noted
Cervix Uterus Adnexa

 Cervix
 Length:            1.6  cm.
 Appears dilated, see comments

 Uterus
 No abnormality visualized.

 Right Ovary
 No adnexal mass visualized.

 Left Ovary
 No adnexal mass visualized. Simple cyst.

 Cul De Sac
 No free fluid seen.

 Adnexa
 No abnormality visualized.

 Comment
 Questionable clot at internal os.
Impression

 Limited exam due to maternal vaginal bleeding
 Oligohydraminos with MVP of 0.6 cm
 Cervix appears dilated but with 1.6 cm in length
 No evidence of placental abruption or previa.
Recommendations

 Mangement per inpatient provider
 MFM consultation performed.
